# Patient Record
Sex: Female | Born: 1995 | Race: White | Hispanic: Yes | Marital: Married | Smoking: Never smoker
Health system: Southern US, Community
[De-identification: ages and names within clinical notes are randomized; demographics above are authoritative.]

## PROBLEM LIST (undated history)

## (undated) ENCOUNTER — Inpatient Hospital Stay: Payer: Self-pay

## (undated) DIAGNOSIS — N912 Amenorrhea, unspecified: Secondary | ICD-10-CM

## (undated) DIAGNOSIS — Z87718 Personal history of other specified (corrected) congenital malformations of genitourinary system: Secondary | ICD-10-CM

## (undated) DIAGNOSIS — Z9229 Personal history of other drug therapy: Secondary | ICD-10-CM

## (undated) DIAGNOSIS — Z789 Other specified health status: Secondary | ICD-10-CM

## (undated) DIAGNOSIS — N926 Irregular menstruation, unspecified: Secondary | ICD-10-CM

## (undated) DIAGNOSIS — N83209 Unspecified ovarian cyst, unspecified side: Secondary | ICD-10-CM

## (undated) HISTORY — PX: EYE SURGERY: SHX253

## (undated) HISTORY — DX: Unspecified ovarian cyst, unspecified side: N83.209

## (undated) HISTORY — DX: Amenorrhea, unspecified: N91.2

## (undated) HISTORY — DX: Personal history of other drug therapy: Z92.29

## (undated) HISTORY — DX: Irregular menstruation, unspecified: N92.6

## (undated) HISTORY — DX: Personal history of other specified (corrected) congenital malformations of genitourinary system: Z87.718

---
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
## 2014-11-05 NOTE — Consult Note (Signed)
Brief Consult Note: Diagnosis: abdominal pain likley secondary to ruptured left ovarian cyst, previous episode in Sept 2013.   Patient was seen by consultant.   Consult note dictated.   Discussed with Attending MD.   Comments: no need for surgical intervention, she is also rather constipated and this can be addressed once discharged and is no longer having nausea or abdominal pain.  Electronic Signatures: Natale LayBird, Melven Stockard (MD)  (Signed 11-Dec-13 23:05)  Authored: Brief Consult Note   Last Updated: 11-Dec-13 23:05 by Natale LayBird, Rolan Wrightsman (MD)

## 2014-11-05 NOTE — Consult Note (Signed)
PATIENT NAME:  Maria Gentry, Maria Gentry MR#:  [id number] DATE OF BIRTH:  02/09/1996  DATE OF CONSULTATION:  06/28/2012  REFERRING PHYSICIAN:   CONSULTING PHYSICIAN:  Dawt Reeb A. Egbert GaribaldiBird, MD  REASON FOR CONSULTATION: Abdominal pain.   HISTORY: This is a 19 year old otherwise healthy white female who presents to the Emergency Room with the acute onset of left lower quadrant abdominal pain associated with nausea and one episode of emesis with low-grade fever at home. She had a similar presentation but not as intense to the Emergency Room in September of this year and was diagnosed with an ovarian cyst. Gynecological follow up has not been performed. The patient has intermittent menstrual periods. She has had no menstrual period for several weeks. Patient has had no sick contacts. While in the Emergency Room a pelvic ultrasound was performed demonstrating no evidence of an ovarian cyst. CT scan was performed demonstrating no evidence of appendicitis but some small amount of free fluid within the pelvis and bilateral adnexal and ovarian fullness. She is seen with her mother.   ALLERGIES: None.   HOME MEDICATIONS: None.   PAST MEDICAL HISTORY: None.   PAST SURGICAL HISTORY: None.   SOCIAL HISTORY: Negative.   FAMILY HISTORY: Noncontributory.   PHYSICAL EXAMINATION:  GENERAL: The patient is alert and oriented, in no obvious distress.   VITAL SIGNS: Temperature 98.1, pulse 69, respiratory rate 18, blood pressure 191/55, room air saturations 100%.   LUNGS: Clear bilaterally.   HEART: Regular rate and rhythm.   ABDOMEN: Soft, scaphoid, minimal tenderness in the left lower quadrant. No peritoneal signs.   RECTAL/GU: Deferred.   EXTREMITIES: Warm and well perfused.   NEUROLOGIC/PSYCHIATRIC: Normal.   MUSCULOSKELETAL: Grossly normal.   LABORATORY, DIAGNOSTIC, AND RADIOLOGICAL DATA: Urine pregnancy test is negative. White count 6.6, hemoglobin 12.5, platelet count 226,000, normal differential. Urinalysis is  negative. Electrolytes are unremarkable. LFTs normal.   IMPRESSION: 19 year old white female with likely recently ruptured ovarian cyst accounting for her abdominal pain, nausea and low-grade fever. I do not see any clinical signs of appendicitis or surgical problem that need intervention at this time. Of note, the patient is markedly constipated seen on CT scan.   RECOMMENDATIONS: Patient has been admitted to pediatrics for observation, hydration and pain control. I recommend gynecological follow up. This can be done as an outpatient. Furthermore, her constipation can be addressed as an outpatient once she is over her acute illness.   TOTAL TIME SPENT: Half an hour.   Spoke directly with Dr. Olegario Shearerharles Scott regarding her care. Call with any questions or concerns.   ____________________________ Redge GainerMark A. Egbert GaribaldiBird, MD mab:cms D: 06/29/2012 06:52:00 ET T: 06/29/2012 08:41:21 ET JOB#: [id number]  cc: Loraine LericheMark A. Egbert GaribaldiBird, MD, <Dictator>  cc:  Olegario Shearerharles Scott, MD Pediatrics Gabriell Daigneault A Kaylob Wallen MD ELECTRONICALLY SIGNED 06/29/2012 23:24

## 2015-12-18 HISTORY — PX: HYMENECTOMY: SHX987

## 2016-02-03 DIAGNOSIS — Z8742 Personal history of other diseases of the female genital tract: Secondary | ICD-10-CM | POA: Diagnosis not present

## 2016-02-03 DIAGNOSIS — Z01818 Encounter for other preprocedural examination: Secondary | ICD-10-CM | POA: Diagnosis not present

## 2016-02-03 DIAGNOSIS — Q529 Congenital malformation of female genitalia, unspecified: Secondary | ICD-10-CM | POA: Diagnosis not present

## 2016-02-10 DIAGNOSIS — N895 Stricture and atresia of vagina: Secondary | ICD-10-CM | POA: Diagnosis not present

## 2016-02-10 DIAGNOSIS — Q524 Other congenital malformations of vagina: Secondary | ICD-10-CM | POA: Diagnosis not present

## 2016-02-18 DIAGNOSIS — Z09 Encounter for follow-up examination after completed treatment for conditions other than malignant neoplasm: Secondary | ICD-10-CM | POA: Diagnosis not present

## 2016-02-18 DIAGNOSIS — Q529 Congenital malformation of female genitalia, unspecified: Secondary | ICD-10-CM | POA: Diagnosis not present

## 2016-04-13 DIAGNOSIS — R300 Dysuria: Secondary | ICD-10-CM | POA: Diagnosis not present

## 2016-04-13 DIAGNOSIS — R350 Frequency of micturition: Secondary | ICD-10-CM | POA: Diagnosis not present

## 2016-04-13 DIAGNOSIS — N390 Urinary tract infection, site not specified: Secondary | ICD-10-CM | POA: Diagnosis not present

## 2016-04-13 DIAGNOSIS — N3001 Acute cystitis with hematuria: Secondary | ICD-10-CM | POA: Diagnosis not present

## 2016-09-17 DIAGNOSIS — Z131 Encounter for screening for diabetes mellitus: Secondary | ICD-10-CM | POA: Diagnosis not present

## 2016-09-17 DIAGNOSIS — D573 Sickle-cell trait: Secondary | ICD-10-CM | POA: Diagnosis not present

## 2016-09-17 DIAGNOSIS — D582 Other hemoglobinopathies: Secondary | ICD-10-CM | POA: Insufficient documentation

## 2016-09-17 DIAGNOSIS — Z0000 Encounter for general adult medical examination without abnormal findings: Secondary | ICD-10-CM | POA: Diagnosis not present

## 2016-11-05 ENCOUNTER — Encounter: Payer: Self-pay | Admitting: Obstetrics and Gynecology

## 2016-11-05 ENCOUNTER — Ambulatory Visit (INDEPENDENT_AMBULATORY_CARE_PROVIDER_SITE_OTHER): Payer: BLUE CROSS/BLUE SHIELD | Admitting: Obstetrics and Gynecology

## 2016-11-05 VITALS — BP 110/68 | Ht 60.0 in | Wt 117.0 lb

## 2016-11-05 DIAGNOSIS — Z124 Encounter for screening for malignant neoplasm of cervix: Secondary | ICD-10-CM | POA: Diagnosis not present

## 2016-11-05 DIAGNOSIS — N912 Amenorrhea, unspecified: Secondary | ICD-10-CM | POA: Insufficient documentation

## 2016-11-05 DIAGNOSIS — Z01419 Encounter for gynecological examination (general) (routine) without abnormal findings: Secondary | ICD-10-CM | POA: Diagnosis not present

## 2016-11-05 DIAGNOSIS — Z113 Encounter for screening for infections with a predominantly sexual mode of transmission: Secondary | ICD-10-CM

## 2016-11-05 MED ORDER — MEDROXYPROGESTERONE ACETATE 10 MG PO TABS: 10.0000 mg | ORAL_TABLET | Freq: Every day | ORAL | 5 refills | Status: DC

## 2016-11-05 NOTE — Progress Notes (Signed)
HPI:      [name] is a 21 y.o. G0P0000 who LMP was Patient's last menstrual period was 07/12/2016., she presents today for her annual examination. The patient has no complaints today. The patient is sexually active. Her history is significant for no pap smears given age.. The patient does not perform self breast exams.  There is no notable family history of breast or ovarian cancer in her family.  The patient has regular exercise: yes.  The patient denies current symptoms of depression.    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 She reports no residual symptoms. She is able to have intercourse without discomfort.  She also reports a long history of essentially no menses.  She states that she has only ever had menses when provoked by a medicine.  She always does respond to hormonal treatment.  She had a workup for this at [facility] last year which showed no abnormal labs (TSH, prolactin, TSH/FT4, DHEA-S, 17OHP, free testosterone was just below the cutoff for normal range, total testosterone was normal, all other labs were normal). She has no headaches, visual issues, galactorrhea, abdominal pain.  She has normal secondary sex characteristics.  Denies unwanted hair growth or balding pattern.  No menses since January.   GYN History: Contraception: none  PMHx: Past Medical History:  Diagnosis Date  . Amenorrhea   . Cyst of ovary   . History of imperforate hymen   . History of vaccination against human papillomavirus   . Irregular menses    Past Surgical History:  Procedure Laterality Date  . EYE SURGERY    . HYMENECTOMY  12/2015   Family History  Problem Relation Age of Onset  . Skin cancer Mother   . Ovarian cancer Other    Social History  Substance Use Topics  . Smoking status: Never Smoker  . Smokeless tobacco: Never Used  . Alcohol use No    Current Outpatient Prescriptions:  .   medroxyPROGESTERone (PROVERA) 10 MG tablet, Take 1 tablet (10 mg total) by mouth daily. Take one course of this medication every 2 months, Disp: 10 tablet, Rfl: 5 .  norethindrone-ethinyl estradiol (JUNEL FE,GILDESS FE,LOESTRIN FE) 1-20 MG-MCG tablet, Take 1 tablet by mouth daily., Disp: , Rfl:  Allergies: Patient has no known allergies.  Review of Systems  Constitutional: Negative.   HENT: Negative.   Eyes: Negative.   Respiratory: Negative.   Cardiovascular: Negative.   Gastrointestinal: Negative.   Genitourinary: Negative.        Absent menses  Musculoskeletal: Negative.   Skin: Negative.   Neurological: Negative.   Psychiatric/Behavioral: Negative.     Objective: BP 110/68   Ht 5' (1.524 m)   Wt 117 lb (53.1 kg)   LMP 07/12/2016   BMI 22.85 kg/m   Filed Weights   11/05/16 0906  Weight: 117 lb (53.1 kg)   Body mass index is 22.85 kg/m. Physical Exam  Constitutional: She is oriented to person, place, and time. She appears well-developed and well-nourished. No distress.  Genitourinary: Uterus normal. Pelvic exam was performed with patient supine. There is no rash, tenderness, lesion or injury on the right labia. There is no rash, tenderness, lesion or injury on the left labia. No erythema, tenderness or bleeding in the vagina. No signs of injury around the vagina. No vaginal discharge found. Right adnexum does not display mass, does not display tenderness and does not display fullness. Left adnexum does  not display mass, does not display tenderness and does not display fullness. Cervix does not exhibit motion tenderness or discharge.   Uterus is mobile and anteverted. Uterus is not enlarged, tender or exhibiting a mass.  HENT:  Head: Normocephalic and atraumatic.  Eyes: EOM are normal. No scleral icterus.  Neck: Normal range of motion. Neck supple. No thyromegaly present.  Cardiovascular: Normal rate and regular rhythm.  Exam reveals no gallop and no friction rub.   No  murmur heard. Pulmonary/Chest: Effort normal and breath sounds normal. No respiratory distress. She has no wheezes. She has no rales. Right breast exhibits no inverted nipple, no mass, no nipple discharge, no skin change and no tenderness. Left breast exhibits no inverted nipple, no mass, no nipple discharge, no skin change and no tenderness.  Abdominal: Soft. Bowel sounds are normal. She exhibits no distension and no mass. There is no tenderness. There is no rebound and no guarding.  Musculoskeletal: Normal range of motion. She exhibits no edema or tenderness.  Lymphadenopathy:    She has no cervical adenopathy.       Right: No inguinal adenopathy present.       Left: No inguinal adenopathy present.  Neurological: She is alert and oriented to person, place, and time. No cranial nerve deficit.  Skin: Skin is warm and dry. No rash noted. No erythema.  Psychiatric: She has a normal mood and affect. Her behavior is normal. Judgment normal.    Female chaperone present during breast and pelvic examinations.  Assessment:  ANNUAL EXAM 1. Women's annual routine gynecological examination   2. Amenorrhea   3. Pap smear for cervical cancer screening   4. Screen for STD (sexually transmitted disease)      Screening Plan:            1.  Cervical Screening-  Pap smear done today  2. Breast screening- Exam annually and mammogram>40 planned   3. Colonoscopy every 10 years, Hemoccult testing - after age 73  4. Labs None ordered today given recent normal findings at [facility] regarding her amenorrhea.    5. Counseling for contraception: Given her amenorrhea, she does not want to be a contraception at this time. She is fine with getting pregnanct, if it happens.  Other:  Amenorrhea: Discussed many possible causes for amenorrhea. Unclear whether this is primary or secondary given her history of microperforate hymen.  She has had an essentially normal workup.  She always has had response to withdrawal  challenge and to oral contraception.  Plan for now is to give her a withdrawal bleed every 2-3 months (or monthly, if she prefers).  May continues this for endometrial protection.  When she is ready to conceive, return to office for ovulation testing, if she has trouble conceiving.  Discussed that she may, but we have not performed a full workup of her amenorrhea. She states that she is ok with what has been done, but will investigate more if she has trouble with pregnancy.  She might also benefit from a pelvic ultrasound, if necessary.  However, she has had transabdominal pelvic ultrasounds in the past that were notable only for ovarian cysts.     F/U  Return in about 1 year (around 11/05/2017) for Annual Gynecologic Examination.  Thomasene Mohair, MD  [facility], [facility] 11/05/2016  1:08 PM

## 2016-11-09 ENCOUNTER — Encounter: Payer: Self-pay | Admitting: Obstetrics and Gynecology

## 2016-11-09 LAB — IGP, CTNG, RFX APTIMA HPV ASCU
Chlamydia, Nuc. Acid Amp: NEGATIVE
GONOCOCCUS BY NUCLEIC ACID AMP: NEGATIVE
PAP SMEAR COMMENT: 0

## 2017-07-20 ENCOUNTER — Ambulatory Visit (INDEPENDENT_AMBULATORY_CARE_PROVIDER_SITE_OTHER): Payer: 59 | Admitting: Obstetrics and Gynecology

## 2017-07-20 ENCOUNTER — Encounter: Payer: Self-pay | Admitting: Obstetrics and Gynecology

## 2017-07-20 VITALS — BP 122/70 | Ht 60.0 in | Wt 128.0 lb

## 2017-07-20 DIAGNOSIS — N970 Female infertility associated with anovulation: Secondary | ICD-10-CM | POA: Diagnosis not present

## 2017-07-20 NOTE — Progress Notes (Signed)
Obstetrics & Gynecology Office Visit   Chief Complaint  Patient presents with  . infertility   History of Present Illness: 22 y.o. G0P0000 female with a history of microperforate hymen.  She also has a history of oligomenorrhea for which she has been taking provera about every 2-3 months.  She has light bleeding that lasts about 6 days as withdrawal bleeding. She states she changes tampons about 4 times per day during this time. She has been actively trying to get pregnant for about a month. However, she has been having unprotected intercourse for about 1 year.  Her LMP is uncertain. However, she took an OTC ovulation prediction kit test that was positive on dates 12/11-12/14. She has not had bleeding since end of October.  She is also monitoring basal body temperature and has had some elevations, but nothing she would call a significant increase.    Past Medical History:  Diagnosis Date  . Amenorrhea   . Cyst of ovary   . History of imperforate hymen   . History of vaccination against human papillomavirus   . Irregular menses     Past Surgical History:  Procedure Laterality Date  . EYE SURGERY    . HYMENECTOMY  12/2015    Gynecologic History: Patient's last menstrual period was 05/18/2017.  Obstetric History: G0P0000  Family History  Problem Relation Age of Onset  . Skin cancer Mother   . Ovarian cancer Other     Social History   Socioeconomic History  . Marital status: Married    Spouse name: Not on file  . Number of children: Not on file  . Years of education: Not on file  . Highest education level: Not on file  Social Needs  . Financial resource strain: Not on file  . Food insecurity - worry: Not on file  . Food insecurity - inability: Not on file  . Transportation needs - medical: Not on file  . Transportation needs - non-medical: Not on file  Occupational History  . Not on file  Tobacco Use  . Smoking status: Never Smoker  . Smokeless tobacco: Never Used    Substance and Sexual Activity  . Alcohol use: No  . Drug use: Not on file  . Sexual activity: Yes    Birth control/protection: None  Other Topics Concern  . Not on file  Social History Narrative  . Not on file    No Known Allergies  Prior to Admission medications   Medication Sig Start Date End Date Taking? Authorizing Provider  medroxyPROGESTERone (PROVERA) 10 MG tablet Take 1 tablet (10 mg total) by mouth daily. Take one course of this medication every 2 months 11/05/16 11/15/16  Will Bonnet, MD  norethindrone-ethinyl estradiol (JUNEL FE,GILDESS FE,LOESTRIN FE) 1-20 MG-MCG tablet Take 1 tablet by mouth daily.    [provider]    Review of Systems  Constitutional: Negative.   HENT: Negative.   Eyes: Negative.   Respiratory: Negative.   Cardiovascular: Negative.   Gastrointestinal: Negative.   Genitourinary: Negative.   Musculoskeletal: Negative.   Skin: Negative.   Neurological: Negative.   Psychiatric/Behavioral: Negative.      Physical Exam BP 122/70   Ht 5' (1.524 m)   Wt 128 lb (58.1 kg)   LMP 05/18/2017   BMI 25.00 kg/m  Patient's last menstrual period was 05/18/2017. Physical Exam  Constitutional: She is oriented to person, place, and time. She appears well-developed and well-nourished. No distress.  HENT:  Head: Normocephalic and atraumatic.  Eyes: Conjunctivae are normal. No scleral icterus.  Neck: Normal range of motion. Neck supple. No thyromegaly present.  Cardiovascular: Normal rate and regular rhythm.  Pulmonary/Chest: Effort normal and breath sounds normal.  Abdominal: Soft. Bowel sounds are normal. She exhibits no distension and no mass. There is no tenderness. There is no guarding.  Lymphadenopathy:    She has no cervical adenopathy.  Neurological: She is alert and oriented to person, place, and time. No cranial nerve deficit.  Skin: Skin is warm and dry. No rash noted.  Psychiatric: She has a normal mood and affect. Her behavior  is normal. Judgment normal.   Female chaperone present for pelvic and breast  portions of the physical exam  Assessment: 22 y.o. G0P0000 female here for  1. Infertility associated with anovulation      Plan: Problem List Items Addressed This Visit    None    Visit Diagnoses    Infertility associated with anovulation    -  Primary   Relevant Orders   US PELVIS TRANSVANGINAL NON-OB (TV ONLY)     25 minutes spent in face to face discussion with > 50% spent in counseling, management, and coordination of care of her infertility, likely due to anovulation. She has had a workup in the past at [facility] which was normal (TSH, prolactin, TSH/FT4, DHEA-S, 17-OHP, free testosterone was just below cutoff range, total testosterone was normal, all other labs normal).  She has responded to withdrawal progesterone.  Plan to get a pelvic ultrasound, a transvaginal has never been obtained to assess normal anatomy.  Will also give her another week to see if she is pregnant.  Consider HSG to assess her anatomy and use day 21 progesterone to assess ovulation status.  Of negative with normal ultrasound, could consider ovulation induction. May also consider semen analysis.  All of this discussed with her and all questions answered.    Prentice Docker, MD 07/20/2017 6:01 PM

## 2017-07-29 ENCOUNTER — Encounter: Payer: Self-pay | Admitting: Obstetrics and Gynecology

## 2017-07-29 ENCOUNTER — Ambulatory Visit (INDEPENDENT_AMBULATORY_CARE_PROVIDER_SITE_OTHER): Payer: 59 | Admitting: Obstetrics and Gynecology

## 2017-07-29 ENCOUNTER — Ambulatory Visit (INDEPENDENT_AMBULATORY_CARE_PROVIDER_SITE_OTHER): Payer: 59

## 2017-07-29 VITALS — BP 100/60 | HR 66 | Ht 60.0 in | Wt 129.5 lb

## 2017-07-29 DIAGNOSIS — N970 Female infertility associated with anovulation: Secondary | ICD-10-CM

## 2017-07-29 DIAGNOSIS — N912 Amenorrhea, unspecified: Secondary | ICD-10-CM | POA: Diagnosis not present

## 2017-07-29 HISTORY — DX: Female infertility associated with anovulation: N97.0

## 2017-07-29 MED ORDER — MEDROXYPROGESTERONE ACETATE 10 MG PO TABS: 10.0000 mg | ORAL_TABLET | Freq: Every day | ORAL | 0 refills | Status: DC

## 2017-07-29 NOTE — Progress Notes (Signed)
Gynecology Ultrasound Follow Up   Chief Complaint  Patient presents with  . Follow-up    u/s  infertility   History of Present Illness: Patient is a 22 y.o. female who presents today for ultrasound evaluation of the above.  I have personally reviewed the images and report for this ultrasound and my interpretation is reflected below.  Ultrasound demonstrates the following findings Adnexa: ovaries with multiple follicles both with volume > 10 cm^3  Uterus: retroverted with endometrial stripe  5.7 mm Additional: no other additional findings  Past Medical History:  Diagnosis Date  . Amenorrhea   . Cyst of ovary   . History of imperforate hymen   . History of vaccination against human papillomavirus   . Irregular menses     Past Surgical History:  Procedure Laterality Date  . EYE SURGERY    . HYMENECTOMY  12/2015    Family History  Problem Relation Age of Onset  . Skin cancer Mother   . Ovarian cancer Other     Social History   Socioeconomic History  . Marital status: Married    Spouse name: Not on file  . Number of children: Not on file  . Years of education: Not on file  . Highest education level: Not on file  Social Needs  . Financial resource strain: Not on file  . Food insecurity - worry: Not on file  . Food insecurity - inability: Not on file  . Transportation needs - medical: Not on file  . Transportation needs - non-medical: Not on file  Occupational History  . Not on file  Tobacco Use  . Smoking status: Never Smoker  . Smokeless tobacco: Never Used  Substance and Sexual Activity  . Alcohol use: No  . Drug use: No  . Sexual activity: Yes    Birth control/protection: None  Other Topics Concern  . Not on file  Social History Narrative  . Not on file   Allergies: No Known Allergies  Prior to Admission medications   Medication Sig Start Date End Date Taking? Authorizing Provider  medroxyPROGESTERone (PROVERA) 10 MG tablet Take 1 tablet (10 mg  total) by mouth daily. Take one course of this medication every 2 months 11/05/16 11/15/16  Conard Novak, MD    Physical Exam BP 100/60   Pulse 66   Ht 5' (1.524 m)   Wt 129 lb 8 oz (58.7 kg)   BMI 25.29 kg/m    General: NAD HEENT: normocephalic, anicteric Pulmonary: No increased work of breathing Extremities: no edema, erythema, or tenderness Neurologic: Grossly intact, normal gait Psychiatric: mood appropriate, affect full   Assessment: 22 y.o. G0P0000 here for  1. Infertility associated with anovulation   2. Amenorrhea     Plan: Problem List Items Addressed This Visit      Genitourinary   Infertility associated with anovulation - Primary   Relevant Medications   medroxyPROGESTERone (PROVERA) 10 MG tablet     Other   Amenorrhea   Relevant Medications   medroxyPROGESTERone (PROVERA) 10 MG tablet     We discussed multiple methods of investigating fertility issues.  However her symptoms are consistent with anovulation.  Her PCO S labs have been negative in the past.  Therefore, she may have PCOS.  However, she has no other stigmata of PCOS in terms of hyperandrogenemia.  Plan is to induce menses and stimulate ovulation using Clomid.  She is well past day 35 of her cycle.  She took a pregnancy test yesterday  that is negative.  20 minutes spent in face to face discussion with > 50% spent in counseling, management, and coordination of care of her infertility.   Thomasene MohairStephen Maurice Ramseur, MD 07/29/2017 5:42 PM

## 2017-08-10 ENCOUNTER — Encounter: Payer: Self-pay | Admitting: Obstetrics and Gynecology

## 2017-08-15 ENCOUNTER — Encounter: Payer: Self-pay | Admitting: Obstetrics and Gynecology

## 2017-08-16 ENCOUNTER — Other Ambulatory Visit: Payer: Self-pay | Admitting: Obstetrics and Gynecology

## 2017-08-16 ENCOUNTER — Encounter: Payer: Self-pay | Admitting: Obstetrics and Gynecology

## 2017-08-16 DIAGNOSIS — N970 Female infertility associated with anovulation: Secondary | ICD-10-CM

## 2017-08-16 MED ORDER — CLOMIPHENE CITRATE 50 MG PO TABS: 50.0000 mg | ORAL_TABLET | Freq: Every day | ORAL | 0 refills | Status: DC

## 2017-09-05 ENCOUNTER — Encounter: Payer: Self-pay | Admitting: Obstetrics and Gynecology

## 2017-09-05 ENCOUNTER — Other Ambulatory Visit: Payer: Self-pay | Admitting: Obstetrics and Gynecology

## 2017-09-05 DIAGNOSIS — N970 Female infertility associated with anovulation: Secondary | ICD-10-CM

## 2017-09-06 ENCOUNTER — Ambulatory Visit: Payer: 59

## 2017-09-06 DIAGNOSIS — N970 Female infertility associated with anovulation: Secondary | ICD-10-CM

## 2017-09-07 LAB — PROGESTERONE: Progesterone: 0.3 ng/mL

## 2017-09-18 ENCOUNTER — Encounter: Payer: Self-pay | Admitting: Obstetrics and Gynecology

## 2017-09-19 ENCOUNTER — Encounter: Payer: Self-pay | Admitting: Obstetrics and Gynecology

## 2017-09-19 ENCOUNTER — Other Ambulatory Visit: Payer: Self-pay | Admitting: Obstetrics and Gynecology

## 2017-09-19 DIAGNOSIS — N970 Female infertility associated with anovulation: Secondary | ICD-10-CM

## 2017-09-19 MED ORDER — MEDROXYPROGESTERONE ACETATE 10 MG PO TABS: 10.0000 mg | ORAL_TABLET | Freq: Every day | ORAL | 0 refills | Status: DC

## 2017-10-06 ENCOUNTER — Encounter: Payer: Self-pay | Admitting: Obstetrics and Gynecology

## 2017-10-06 ENCOUNTER — Other Ambulatory Visit: Payer: Self-pay | Admitting: Obstetrics and Gynecology

## 2017-10-06 DIAGNOSIS — N970 Female infertility associated with anovulation: Secondary | ICD-10-CM

## 2017-10-06 MED ORDER — CLOMIPHENE CITRATE 50 MG PO TABS: 100.0000 mg | ORAL_TABLET | Freq: Every day | ORAL | 0 refills | Status: DC

## 2017-10-26 ENCOUNTER — Other Ambulatory Visit (INDEPENDENT_AMBULATORY_CARE_PROVIDER_SITE_OTHER): Payer: 59

## 2017-10-26 DIAGNOSIS — N970 Female infertility associated with anovulation: Secondary | ICD-10-CM

## 2017-10-27 ENCOUNTER — Encounter: Payer: Self-pay | Admitting: Obstetrics and Gynecology

## 2017-10-27 LAB — PROGESTERONE: PROGESTERONE: 0.4 ng/mL

## 2017-11-01 ENCOUNTER — Encounter: Payer: Self-pay | Admitting: Obstetrics and Gynecology

## 2017-11-01 ENCOUNTER — Telehealth: Payer: Self-pay | Admitting: Obstetrics and Gynecology

## 2017-11-01 NOTE — Telephone Encounter (Signed)
Left generic VM 

## 2017-11-01 NOTE — Telephone Encounter (Signed)
Spoke with patient regarding medication management for ovulation induction. She has not ovulated with clomid 100 mg. Discussed the option of increasing clomid to 150 mg or switching agents.  Discussed that clomid 150 mg or letrozole (any dose) is an off-label medication/dose for ovulation induction.  She voiced understanding. After discussion, mutual decision made to switch to letrozole in the coming cycle. Will start at 5 mg for her first cycle.   All questions answered.

## 2017-11-01 NOTE — Telephone Encounter (Signed)
Patient is calling for labs results. Please advise. 

## 2017-11-09 ENCOUNTER — Encounter: Payer: Self-pay | Admitting: Obstetrics and Gynecology

## 2017-11-09 ENCOUNTER — Other Ambulatory Visit: Payer: Self-pay | Admitting: Obstetrics and Gynecology

## 2017-11-09 ENCOUNTER — Telehealth: Payer: Self-pay

## 2017-11-09 DIAGNOSIS — N970 Female infertility associated with anovulation: Secondary | ICD-10-CM

## 2017-11-09 NOTE — Telephone Encounter (Signed)
Pt needs refill on provera.  She and SDJ have been messaging about her meds.  States trouble c msg system and wanted to be sure he got this.  [phone]

## 2017-11-10 ENCOUNTER — Other Ambulatory Visit: Payer: Self-pay | Admitting: Obstetrics and Gynecology

## 2017-11-10 DIAGNOSIS — N912 Amenorrhea, unspecified: Secondary | ICD-10-CM

## 2017-11-10 DIAGNOSIS — N970 Female infertility associated with anovulation: Secondary | ICD-10-CM

## 2017-11-10 MED ORDER — MEDROXYPROGESTERONE ACETATE 10 MG PO TABS
10.0000 mg | ORAL_TABLET | Freq: Every day | ORAL | 0 refills | Status: DC
Start: 2017-11-10 — End: 2018-06-02

## 2017-11-27 ENCOUNTER — Encounter: Payer: Self-pay | Admitting: Obstetrics and Gynecology

## 2017-11-28 ENCOUNTER — Encounter: Payer: Self-pay | Admitting: Obstetrics and Gynecology

## 2017-11-29 ENCOUNTER — Other Ambulatory Visit: Payer: Self-pay | Admitting: Obstetrics and Gynecology

## 2017-11-29 DIAGNOSIS — N970 Female infertility associated with anovulation: Secondary | ICD-10-CM

## 2017-11-29 MED ORDER — LETROZOLE 2.5 MG PO TABS: 5.0000 mg | ORAL_TABLET | Freq: Every day | ORAL | 0 refills | Status: AC

## 2017-11-30 NOTE — Telephone Encounter (Signed)
SDJ RXed letrozole yesterday and we have talked to patient.

## 2017-12-15 ENCOUNTER — Other Ambulatory Visit: Payer: Self-pay | Admitting: Obstetrics and Gynecology

## 2017-12-15 ENCOUNTER — Telehealth: Payer: Self-pay

## 2017-12-15 DIAGNOSIS — N970 Female infertility associated with anovulation: Secondary | ICD-10-CM

## 2017-12-15 NOTE — Telephone Encounter (Signed)
Pt calling wanting to let SDJ know that day 21 of cycle will be this Saturday. Can you send this to him and make sure he sees it this PM for lab order?

## 2017-12-15 NOTE — Telephone Encounter (Signed)
Phone line is busy will try to reach patient at another time

## 2017-12-15 NOTE — Telephone Encounter (Signed)
Lm with pt informing her to come in on Monday for labs. Please schedule when pt calls back, or try to call her back soon if she does not call back first to schedule this. Order is in.

## 2017-12-16 NOTE — Telephone Encounter (Signed)
Called and left voice mail for patient to call back to be schedule °

## 2017-12-19 ENCOUNTER — Encounter: Payer: Self-pay | Admitting: Obstetrics and Gynecology

## 2017-12-19 ENCOUNTER — Other Ambulatory Visit: Payer: Self-pay | Admitting: Obstetrics and Gynecology

## 2017-12-19 DIAGNOSIS — N970 Female infertility associated with anovulation: Secondary | ICD-10-CM

## 2017-12-21 ENCOUNTER — Other Ambulatory Visit (INDEPENDENT_AMBULATORY_CARE_PROVIDER_SITE_OTHER): Payer: 59

## 2017-12-21 DIAGNOSIS — N970 Female infertility associated with anovulation: Secondary | ICD-10-CM

## 2017-12-22 LAB — PROGESTERONE: Progesterone: 14.4 ng/mL

## 2017-12-24 ENCOUNTER — Encounter: Payer: Self-pay | Admitting: Obstetrics and Gynecology

## 2018-01-03 ENCOUNTER — Other Ambulatory Visit: Payer: Self-pay | Admitting: Obstetrics and Gynecology

## 2018-01-03 DIAGNOSIS — N970 Female infertility associated with anovulation: Secondary | ICD-10-CM

## 2018-01-04 ENCOUNTER — Other Ambulatory Visit: Payer: Self-pay | Admitting: Obstetrics and Gynecology

## 2018-01-04 DIAGNOSIS — N970 Female infertility associated with anovulation: Secondary | ICD-10-CM

## 2018-01-04 MED ORDER — LETROZOLE 2.5 MG PO TABS: 5.0000 mg | ORAL_TABLET | Freq: Every day | ORAL | 0 refills | Status: AC

## 2018-01-22 ENCOUNTER — Encounter: Payer: Self-pay | Admitting: Obstetrics and Gynecology

## 2018-01-27 ENCOUNTER — Other Ambulatory Visit: Payer: 59

## 2018-01-27 DIAGNOSIS — N970 Female infertility associated with anovulation: Secondary | ICD-10-CM

## 2018-01-28 LAB — PROGESTERONE: PROGESTERONE: 12.4 ng/mL

## 2018-02-03 ENCOUNTER — Other Ambulatory Visit: Payer: Self-pay | Admitting: Obstetrics and Gynecology

## 2018-02-03 DIAGNOSIS — N970 Female infertility associated with anovulation: Secondary | ICD-10-CM

## 2018-02-03 MED ORDER — LETROZOLE 2.5 MG PO TABS: 5.0000 mg | ORAL_TABLET | Freq: Every day | ORAL | 0 refills | Status: AC

## 2018-02-06 ENCOUNTER — Other Ambulatory Visit: Payer: Self-pay | Admitting: Obstetrics and Gynecology

## 2018-02-06 DIAGNOSIS — N970 Female infertility associated with anovulation: Secondary | ICD-10-CM

## 2018-03-01 ENCOUNTER — Other Ambulatory Visit: Payer: 59

## 2018-03-01 DIAGNOSIS — N970 Female infertility associated with anovulation: Secondary | ICD-10-CM

## 2018-03-02 LAB — PROGESTERONE: Progesterone: 18.8 ng/mL

## 2018-03-10 ENCOUNTER — Other Ambulatory Visit: Payer: Self-pay | Admitting: Obstetrics and Gynecology

## 2018-03-10 ENCOUNTER — Telehealth: Payer: Self-pay | Admitting: Obstetrics and Gynecology

## 2018-03-10 DIAGNOSIS — N970 Female infertility associated with anovulation: Secondary | ICD-10-CM

## 2018-03-10 MED ORDER — LETROZOLE 2.5 MG PO TABS: 5.0000 mg | ORAL_TABLET | Freq: Every day | ORAL | 0 refills | Status: AC

## 2018-03-10 NOTE — Telephone Encounter (Signed)
Patient is scheduled for HSG at [facility] on Thursday, 03/16/18 @ 1:30pm, [facility] Lmtrc.

## 2018-03-10 NOTE — Telephone Encounter (Signed)
Lmtrc

## 2018-03-10 NOTE — Telephone Encounter (Signed)
-----   Message from Conard NovakStephen D Jackson, MD sent at 03/10/2018  3:40 PM EDT ----- Regarding: HSG Please schedule for HSG next week. Cycle day 1 is today. She prefers earlier in the week. If that works out and I can block my schedule first thing in the afternoon, that's fine, too. Order is in the system.  Thomasene MohairStephen Jackson, MD

## 2018-03-13 ENCOUNTER — Telehealth: Payer: Self-pay | Admitting: Obstetrics and Gynecology

## 2018-03-13 NOTE — Telephone Encounter (Signed)
Patient has questions inregard to this appointment please advise

## 2018-03-13 NOTE — Telephone Encounter (Signed)
LMVM TRC. I can answer questions about procedure. Insurance questions will be directed to Research officer, political partyractice Administrator in Referral coordinators absence.

## 2018-03-14 NOTE — Telephone Encounter (Signed)
Spoke w/pt. Questions answered about where to report for procedure and procedure itself. Pt has questions about if the procedure is covered by her insurance and what her responsibility will be. It is diagnostic & not fertility related. Cb#[phone]

## 2018-03-16 ENCOUNTER — Ambulatory Visit
Admission: RE | Admit: 2018-03-16 | Discharge: 2018-03-16 | Disposition: A | Discharge status: Home / Self Care | Payer: 59 | Source: Ambulatory Visit | Attending: Obstetrics and Gynecology | Admitting: Obstetrics and Gynecology

## 2018-03-16 DIAGNOSIS — Q51810 Arcuate uterus: Secondary | ICD-10-CM | POA: Insufficient documentation

## 2018-03-16 DIAGNOSIS — N970 Female infertility associated with anovulation: Secondary | ICD-10-CM

## 2018-03-16 DIAGNOSIS — N979 Female infertility, unspecified: Secondary | ICD-10-CM | POA: Insufficient documentation

## 2018-03-16 MED ORDER — IOPAMIDOL (ISOVUE-370) INJECTION 76%
10.0000 mL | Freq: Once | INTRAVENOUS | Status: AC | PRN
  Administered 2018-03-16: 10 mL

## 2018-03-16 NOTE — Procedures (Signed)
Pre-procedure Diagnosis: infertility, female  Post-procedure Diagnosis: 1) infertility, female  2) arcuate uterus  Procedure performed by: Thomasene MohairStephen Avontae Burkhead, MD   Indication: infertility, has not responded to ovulation induction  Complications: none  Patient Disposition: Discharged to home in stable condition  Procedure Details:  Procedure reviewed in detail.  Verification of allergies performed. Prep and drape done.  Cervix normal.  Catheter placed into uterine cavity. Balloon on catheter inflated with air. Approximately 4 mL IsoVue 370 dye injected under fluoroscopic visualization.  Patent bilateral fallopian tube seen.  Uterus somewhat arcuate in contour.  Catheter Patient stable, tolerated procedure well without complication.    Thomasene MohairStephen Crimson Beer, MD, Merlinda FrederickFACOG [facility], [facility] 03/16/2018 1:48 PM

## 2018-03-22 ENCOUNTER — Telehealth: Payer: Self-pay | Admitting: Obstetrics and Gynecology

## 2018-03-22 NOTE — Telephone Encounter (Signed)
Discussed HSG results.  Will get semen analysis if this cycle is not successful

## 2018-03-29 ENCOUNTER — Other Ambulatory Visit: Payer: Self-pay | Admitting: Obstetrics and Gynecology

## 2018-03-29 DIAGNOSIS — N970 Female infertility associated with anovulation: Secondary | ICD-10-CM

## 2018-04-05 ENCOUNTER — Other Ambulatory Visit: Payer: 59

## 2018-04-05 DIAGNOSIS — N970 Female infertility associated with anovulation: Secondary | ICD-10-CM

## 2018-04-06 LAB — PROGESTERONE: Progesterone: 34.2 ng/mL

## 2018-04-07 ENCOUNTER — Telehealth: Payer: Self-pay | Admitting: Obstetrics and Gynecology

## 2018-04-07 NOTE — Telephone Encounter (Signed)
Patient is schedule for NOB appt 04/18/18 with SDJ per patient request

## 2018-04-07 NOTE — Telephone Encounter (Signed)
Talking with pt via pt message. Do you want her to come in early for a blood test or just wait until her appt?

## 2018-04-07 NOTE — Telephone Encounter (Signed)
That's great news! I don't have any reason for her to come in for blood work if her test at home was positive. I'll just look forward to seeing her 10/1!

## 2018-04-07 NOTE — Telephone Encounter (Signed)
Pt aware via message

## 2018-04-07 NOTE — Telephone Encounter (Signed)
Patient is calling to see if she can come in for a blood test for pregnancy. She just cam in on 04/05/18 and now has positive home test. Please advise

## 2018-04-18 ENCOUNTER — Other Ambulatory Visit (HOSPITAL_COMMUNITY)
Admission: RE | Admit: 2018-04-18 | Discharge: 2018-04-18 | Disposition: A | Discharge status: Home / Self Care | Payer: 59 | Source: Ambulatory Visit | Attending: Obstetrics and Gynecology | Admitting: Obstetrics and Gynecology

## 2018-04-18 ENCOUNTER — Encounter: Payer: Self-pay | Admitting: Obstetrics and Gynecology

## 2018-04-18 ENCOUNTER — Ambulatory Visit (INDEPENDENT_AMBULATORY_CARE_PROVIDER_SITE_OTHER): Payer: 59 | Admitting: Obstetrics and Gynecology

## 2018-04-18 VITALS — BP 110/70 | Wt 136.0 lb

## 2018-04-18 DIAGNOSIS — Z3481 Encounter for supervision of other normal pregnancy, first trimester: Secondary | ICD-10-CM | POA: Diagnosis present

## 2018-04-18 DIAGNOSIS — Z113 Encounter for screening for infections with a predominantly sexual mode of transmission: Secondary | ICD-10-CM

## 2018-04-18 DIAGNOSIS — Z3401 Encounter for supervision of normal first pregnancy, first trimester: Secondary | ICD-10-CM

## 2018-04-18 DIAGNOSIS — Z3480 Encounter for supervision of other normal pregnancy, unspecified trimester: Secondary | ICD-10-CM | POA: Insufficient documentation

## 2018-04-18 DIAGNOSIS — Z3A01 Less than 8 weeks gestation of pregnancy: Secondary | ICD-10-CM

## 2018-04-18 LAB — OB RESULTS CONSOLE VARICELLA ZOSTER ANTIBODY, IGG: Varicella: NON-IMMUNE/NOT IMMUNE

## 2018-04-18 LAB — OB RESULTS CONSOLE GC/CHLAMYDIA: Gonorrhea: NEGATIVE

## 2018-04-18 NOTE — Progress Notes (Signed)
New Obstetric Patient H&P   Chief Complaint: "Desires prenatal care"  History of Present Illness: Patient is a 22 y.o. G1P0000 Hispanic or Latino female, LMP 03/10/2018 presents with amenorrhea and positive home pregnancy test. Based on her  LMP, her EDD is Estimated Date of Delivery: 12/15/2018 and her EGA is [redacted]w[redacted]d.  The patient had been taking letrozole 5 mg for ovulation induction and recently underwent an HSG.  She had a urine pregnancy test which was positive 10 or 11 day(s)  ago. Her last menstrual period was normal and lasted for  3 or 4 day(s). Since her LMP she claims she has experienced no issues. She had one episode of vaginal spotting a week ago. Her past medical history is noncontributory. This is her first pregnancy.  Since her LMP, she admits to the use of tobacco products  no She claims she has gained zero  pounds since the start of her pregnancy.  There are cats in the home in the home  yes If yes husband does change litter box. She admits close contact with children on a regular basis  no  She has had chicken pox in the past no She has had Tuberculosis exposures, symptoms, or previously tested positive for TB   no Current or past history of domestic violence. no  Genetic Screening/Teratology Counseling: (Includes patient, baby's father, or anyone in either family with:)   1. Patient's age >/= 49 at [facility]  no 2. Thalassemia ([place], [place], Mediterranean, or Asian background): MCV<80  no 3. Neural tube defect (meningomyelocele, spina bifida, anencephaly)  no 4. Congenital heart defect  no  5. Down syndrome  no 6. Tay-Sachs (Jewish, [place]  no 7. Canavan's Disease  no 8. Sickle cell disease or trait (African)  Yes mom has trait. Husband unknown.  9. Hemophilia or other blood disorders  no  10. Muscular dystrophy  no  11. Cystic fibrosis  no  12. Huntington's Chorea  no  13. Mental retardation/autism  no 14. Other inherited genetic or chromosomal disorder   no 15. Maternal metabolic disorder (DM, PKU, etc)  no 16. Patient or FOB with a child with a birth defect not listed above no  16a. Patient or FOB with a birth defect themselves no 17. Recurrent pregnancy loss, or stillbirth  no  18. Any medications since LMP other than prenatal vitamins (include vitamins, supplements, OTC meds, drugs, alcohol)  Yes, letrozole.  19. Any other genetic/environmental exposure to discuss  no  Infection History:   1. Lives with someone with TB or TB exposed  no  2. Patient or partner has history of genital herpes  no 3. Rash or viral illness since LMP  no 4. History of STI (GC, CT, HPV, syphilis, HIV)  no 5. History of recent travel :  no  Other pertinent information:  no    Review of Systems:10 point review of systems negative unless otherwise noted in HPI  Past Medical History:  Diagnosis Date  . Amenorrhea   . Cyst of ovary   . History of imperforate hymen   . History of vaccination against human papillomavirus   . Irregular menses     Past Surgical History:  Procedure Laterality Date  . EYE SURGERY    . HYMENECTOMY  12/2015    Gynecologic History: Patient's last menstrual period was 03/10/2018.  Obstetric History: G1P0000  Family History  Problem Relation Age of Onset  . Skin cancer Mother   . Ovarian cancer Other  Social History   Socioeconomic History  . Marital status: Married    Spouse name: Not on file  . Number of children: Not on file  . Years of education: Not on file  . Highest education level: Not on file  Occupational History  . Not on file  Social Needs  . Financial resource strain: Not on file  . Food insecurity:    Worry: Not on file    Inability: Not on file  . Transportation needs:    Medical: Not on file    Non-medical: Not on file  Tobacco Use  . Smoking status: Never Smoker  . Smokeless tobacco: Never Used  Substance and Sexual Activity  . Alcohol use: No  . Drug use: No  . Sexual activity:  Yes    Birth control/protection: None  Lifestyle  . Physical activity:    Days per week: Not on file    Minutes per session: Not on file  . Stress: Not on file  Relationships  . Social connections:    Talks on phone: Not on file    Gets together: Not on file    Attends religious service: Not on file    Active member of club or organization: Not on file    Attends meetings of clubs or organizations: Not on file    Relationship status: Not on file  . Intimate partner violence:    Fear of current or ex partner: Not on file    Emotionally abused: Not on file    Physically abused: Not on file    Forced sexual activity: Not on file  Other Topics Concern  . Not on file  Social History Narrative  . Not on file   Allergies: No Known Allergies  Prior to Admission medications: PNV   Physical Exam BP 110/70   Wt 136 lb (61.7 kg)   LMP 03/10/2018   BMI 26.56 kg/m   Physical Exam  Constitutional: She is oriented to person, place, and time. She appears well-developed and well-nourished. No distress.  HENT:  Head: Normocephalic and atraumatic.  Eyes: Conjunctivae are normal.  Neck: Normal range of motion. Neck supple. No thyromegaly present.  Cardiovascular: Normal rate, regular rhythm and normal heart sounds. Exam reveals no gallop and no friction rub.  No murmur heard. Pulmonary/Chest: Effort normal and breath sounds normal. She has no wheezes.  Abdominal: Soft. She exhibits no distension and no mass. There is no tenderness. There is no rebound and no guarding. No hernia. Hernia confirmed negative in the right inguinal area and confirmed negative in the left inguinal area.  Genitourinary: Pelvic exam was performed with patient supine. There is no rash, tenderness or lesion on the right labia. There is no rash, tenderness or lesion on the left labia.  Musculoskeletal: Normal range of motion.  Lymphadenopathy:       Right: No inguinal adenopathy present.       Left: No inguinal  adenopathy present.  Neurological: She is alert and oriented to person, place, and time.  Skin: Skin is warm and dry. No rash noted.  Psychiatric: She has a normal mood and affect. Her behavior is normal.    Female Chaperone present during breast and/or pelvic exam.  Assessment: 22 y.o. G1P0000 at [redacted]w[redacted]d presenting to initiate prenatal care  Plan: 1) Avoid alcoholic beverages. 2) Patient encouraged not to smoke.  3) Discontinue the use of all non-medicinal drugs and chemicals.  4) Take prenatal vitamins daily.  5) Nutrition, food safety (fish, cheese  advisories, and high nitrite foods) and exercise discussed. 6) Hospital and practice style discussed with cross coverage system.  7) Genetic Screening, such as with 1st Trimester Screening, cell free fetal DNA, AFP testing, and Ultrasound, as well as with amniocentesis and CVS as appropriate, is discussed with patient. At the conclusion of today's visit patient undecided genetic testing 8) Patient is asked about travel to areas at risk for the [place] virus, and counseled to avoid travel and exposure to mosquitoes or sexual partners who may have themselves been exposed to the virus. Testing is discussed, and will be ordered as appropriate.   Thomasene Mohair, MD 04/18/2018 2:57 PM

## 2018-04-20 LAB — CERVICOVAGINAL ANCILLARY ONLY
CHLAMYDIA, DNA PROBE: NEGATIVE
NEISSERIA GONORRHEA: NEGATIVE

## 2018-04-20 LAB — URINE DRUG PANEL 7
AMPHETAMINES, URINE: NEGATIVE ng/mL
BARBITURATE QUANT UR: NEGATIVE ng/mL
Benzodiazepine Quant, Ur: NEGATIVE ng/mL
Cannabinoid Quant, Ur: NEGATIVE ng/mL
Cocaine (Metab.): NEGATIVE ng/mL
Opiate Quant, Ur: NEGATIVE ng/mL
PCP Quant, Ur: NEGATIVE ng/mL

## 2018-04-20 LAB — URINE CULTURE

## 2018-04-21 LAB — RPR+RH+ABO+RUB AB+AB SCR+CB...
Antibody Screen: NEGATIVE
HEMATOCRIT: 36.9 % (ref 34.0–46.6)
HEMOGLOBIN: 11.9 g/dL (ref 11.1–15.9)
HEP B S AG: NEGATIVE
HIV Screen 4th Generation wRfx: NONREACTIVE
MCH: 27.5 pg (ref 26.6–33.0)
MCHC: 32.2 g/dL (ref 31.5–35.7)
MCV: 85 fL (ref 79–97)
Platelets: 337 10*3/uL (ref 150–450)
RBC: 4.33 x10E6/uL (ref 3.77–5.28)
RDW: 13.3 % (ref 12.3–15.4)
RH TYPE: POSITIVE
RPR Ser Ql: NONREACTIVE
RUBELLA: 2.82 {index} (ref >0.99)
WBC: 9.7 10*3/uL (ref 3.4–10.8)

## 2018-04-21 LAB — HEMOGLOBINOPATHY EVALUATION
HEMOGLOBIN A2 QUANTITATION: 2.9 % (ref 1.8–3.2)
HEMOGLOBIN F QUANTITATION: 0 % (ref 0.0–2.0)
HGB C: 37.9 % — AB
HGB S: 0 %
HGB VARIANT: 0 %
Hgb A: 59.2 % — ABNORMAL LOW (ref 96.4–98.8)

## 2018-04-28 ENCOUNTER — Ambulatory Visit (INDEPENDENT_AMBULATORY_CARE_PROVIDER_SITE_OTHER): Payer: 59 | Admitting: Obstetrics and Gynecology

## 2018-04-28 ENCOUNTER — Ambulatory Visit (INDEPENDENT_AMBULATORY_CARE_PROVIDER_SITE_OTHER): Payer: 59

## 2018-04-28 ENCOUNTER — Encounter: Payer: Self-pay | Admitting: Obstetrics and Gynecology

## 2018-04-28 VITALS — BP 114/74 | Wt 136.0 lb

## 2018-04-28 DIAGNOSIS — O3680X0 Pregnancy with inconclusive fetal viability, not applicable or unspecified: Secondary | ICD-10-CM | POA: Diagnosis not present

## 2018-04-28 DIAGNOSIS — D573 Sickle-cell trait: Secondary | ICD-10-CM

## 2018-04-28 DIAGNOSIS — O9989 Other specified diseases and conditions complicating pregnancy, childbirth and the puerperium: Secondary | ICD-10-CM

## 2018-04-28 DIAGNOSIS — Z3480 Encounter for supervision of other normal pregnancy, unspecified trimester: Secondary | ICD-10-CM

## 2018-04-28 DIAGNOSIS — Z3A01 Less than 8 weeks gestation of pregnancy: Secondary | ICD-10-CM

## 2018-04-28 NOTE — Progress Notes (Signed)
Routine Prenatal Care Visit  Subjective  Maria Gentry is a 22 y.o. G1P0000 at [redacted]w[redacted]d being seen today for ongoing prenatal care.  She is currently monitored for the following issues for this low-risk pregnancy and has Amenorrhea; Sickle cell trait (HCC); Infertility associated with anovulation; Infertility, female; and Supervision of other normal pregnancy, antepartum on their problem list.  ----------------------------------------------------------------------------------- Patient reports no complaints.  Mild nausea, no emesis.   . Vag. Bleeding: None.   . Denies leaking of fluid.  ----------------------------------------------------------------------------------- The following portions of the patient's history were reviewed and updated as appropriate: allergies, current medications, past family history, past medical history, past social history, past surgical history and problem list. Problem list updated.  Objective  Blood pressure 114/74, weight 136 lb (61.7 kg), last menstrual period 03/10/2018. Pregravid weight 136 lb (61.7 kg) Total Weight Gain 0 lb (0 kg) Urinalysis: Urine Protein    Urine Glucose    Fetal Status: Fetal Heart Rate (bpm): Present         General:  Alert, oriented and cooperative. Patient is in no acute distress.  Skin: Skin is warm and dry. No rash noted.   Cardiovascular: Normal heart rate noted  Respiratory: Normal respiratory effort, no problems with respiration noted  Abdomen: Soft, gravid, appropriate for gestational age. Pain/Pressure: Absent     Pelvic:  Cervical exam deferred        Extremities: Normal range of motion.     Mental Status: Normal mood and affect. Normal behavior. Normal judgment and thought content.   US Ob Comp Less 14 Wks  Result Date: 04/28/2018 Patient Name: Maria Gentry DOB: 01-01-1996 MRN: [id number] ULTRASOUND REPORT Location: [facility] Date of Service: 04/28/2018 Indications: Pregnancy Dating and viability Findings:  Singleton intrauterine pregnancy is visualized with a CRL consistent with [redacted]w[redacted]d gestation, giving an (U/S) EDD of 12/18/18. The (U/S) EDD is consistent with the clinically established EDD of 12/15/18. FHR: 128 BPM CRL measurement: 6.8 mm Yolk sac is visualized and measured 1.9 mm. Amnion: not visualized Right Ovary is normal in appearance.  However, it is surrounded by free fluid that contains  a simple cyst = 1.05 x 1.07 cm attached to an echogenic,vascular structure = 1.73 x 0.9 cm (Differential includes ectopic pregnancy, portion of fallopian tube, etc.) Left Ovary is normal appearance. Corpus luteal cyst:  is not visualized Survey of the adnexa demonstrates no adnexal masses. There is a  free peritoneal fluid in the posterior right  cul de sac. = 3 x 4 cm Impression: 1. [redacted]w[redacted]d Viable Singleton Intrauterine pregnancy by U/S. 2. (U/S) EDD is consistent with Clinically established EDD of 12/15/18. 3. Right Ovary is normal in appearance.  However, it is surrounded by free fluid that contains  a simple cyst = 1.05 x 1.07 cm attached to an echogenic,vascular structure = 1.73 x 0.9 cm (Differential includes ectopic pregnancy, portion of fallopian tube, etc.) 4.free peritoneal fluid in the posterior Right  cul de sac. = 3 x 4cm Recommendations: 1.Clinical correlation with the patient's History and Physical Exam. 2. Follow up ultrasound in one week, if patient is asymptomatic. Abeer Alsammarraie, RDMS The ultrasound images and findings were reviewed by me and I agree with the above report. Thomasene Mohair, MD, [facility], [facility] 04/28/2018 12:42 PM       Assessment   22 y.o. G1P0000 at [redacted]w[redacted]d by  12/15/2018, by Last Menstrual Period presenting for routine prenatal visit  Plan   pregnancy Problems (from 04/18/18 to present)  Problem Noted Resolved   Supervision of other normal pregnancy, antepartum 04/18/2018 by Conard Novak, MD No   Overview Signed 04/28/2018 12:45 PM by Conard Novak, MD    Clinic [facility] Prenatal Labs  Dating L=7 Blood type: B/Positive/-- (10/01 1548)   Genetic Screen 1 Screen:    AFP:     Quad:     NIPS: Antibody:Negative (10/01 1548)  Anatomic [place]  Rubella: 2.82 (10/01 1548) Varicella: @VZVIGG @  GTT Early:               Third trimester:  RPR: Non Reactive (10/01 1548)   Rhogam n/a HBsAg: Negative (10/01 1548)   TDaP vaccine                       Flu Shot: HIV: Non Reactive (10/01 1548)   Baby Food                                GBS:   Contraception  Pap:  CBB     CS/VBAC    Support Person Riki Rusk         Sickle cell trait (HCC) 09/17/2016 by Conard Novak, MD No   Overview Signed 04/28/2018 12:43 PM by Conard Novak, MD    Husband Riki Rusk tested on 10/11 for hemoglobinopathy [ ]          Preterm labor symptoms and general obstetric precautions including but not limited to vaginal bleeding, contractions, leaking of fluid and fetal movement were reviewed in detail with the patient. Please refer to After Visit Summary for other counseling recommendations.   -Discussed ultrasound findings in detail.  Discussed differential, including heterotopic pregnancy, fallopian tube visualized, ruptured cyst, unknown etiology.  Based on the fact that she has no symptoms currently and the risk of heterotopic pregnancy is very low in the general population, plan to follow with serial ultrasound in about 1 week.  Explained both risks and benefits of this approach to the patient and she voiced understanding and agreement with the plan. 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  Return in about 4 days (around 05/02/2018) for u/s for follow up and routine prenatal with Dr. Jean Rosenthal.  Thomasene Mohair, MD, [facility], [facility] 04/28/2018 12:44 PM

## 2018-05-05 ENCOUNTER — Ambulatory Visit (INDEPENDENT_AMBULATORY_CARE_PROVIDER_SITE_OTHER): Payer: 59 | Admitting: Obstetrics and Gynecology

## 2018-05-05 ENCOUNTER — Ambulatory Visit (INDEPENDENT_AMBULATORY_CARE_PROVIDER_SITE_OTHER): Payer: 59

## 2018-05-05 VITALS — BP 116/80 | Wt 137.0 lb

## 2018-05-05 DIAGNOSIS — O9989 Other specified diseases and conditions complicating pregnancy, childbirth and the puerperium: Secondary | ICD-10-CM

## 2018-05-05 DIAGNOSIS — Z3480 Encounter for supervision of other normal pregnancy, unspecified trimester: Secondary | ICD-10-CM

## 2018-05-05 DIAGNOSIS — O3680X0 Pregnancy with inconclusive fetal viability, not applicable or unspecified: Secondary | ICD-10-CM

## 2018-05-05 DIAGNOSIS — Z3A01 Less than 8 weeks gestation of pregnancy: Secondary | ICD-10-CM | POA: Diagnosis not present

## 2018-05-05 DIAGNOSIS — Z3A08 8 weeks gestation of pregnancy: Secondary | ICD-10-CM

## 2018-05-05 DIAGNOSIS — D573 Sickle-cell trait: Secondary | ICD-10-CM

## 2018-05-05 LAB — POCT URINALYSIS DIPSTICK OB
Glucose, UA: NEGATIVE
PROTEIN: NEGATIVE

## 2018-05-05 NOTE — Progress Notes (Signed)
Routine Prenatal Care Visit  Subjective  Maria Gentry is a 22 y.o. G1P0000 at [redacted]w[redacted]d being seen today for ongoing prenatal care.  She is currently monitored for the following issues for this low-risk pregnancy and has Amenorrhea; Sickle cell trait (HCC); Infertility associated with anovulation; Infertility, female; and Supervision of other normal pregnancy, antepartum on their problem list.  ----------------------------------------------------------------------------------- Patient reports no complaints.    . Vag. Bleeding: None.   . Denies leaking of fluid.  No abdominal pain.  U/S shows essentially stable right para-ovarian lesion of uncertain significance.  ----------------------------------------------------------------------------------- The following portions of the patient's history were reviewed and updated as appropriate: allergies, current medications, past family history, past medical history, past social history, past surgical history and problem list. Problem list updated.   Objective  Blood pressure 116/80, weight 137 lb (62.1 kg), last menstrual period 03/10/2018. Pregravid weight 136 lb (61.7 kg) Total Weight Gain 1 lb (0.454 kg) Urinalysis: Urine Protein Negative  Urine Glucose Negative  Fetal Status: Fetal Heart Rate (bpm): Present         General:  Alert, oriented and cooperative. Patient is in no acute distress.  Skin: Skin is warm and dry. No rash noted.   Cardiovascular: Normal heart rate noted  Respiratory: Normal respiratory effort, no problems with respiration noted  Abdomen: Soft, gravid, appropriate for gestational age. Pain/Pressure: Absent     Pelvic:  Cervical exam deferred        Extremities: Normal range of motion.     Mental Status: Normal mood and affect. Normal behavior. Normal judgment and thought content.   Imaging Results: US Ob Comp Less 14 Wks  Result Date: 05/05/2018 .Patient Name: Maria Gentry DOB: 1996-04-04 MRN: [id number] ULTRASOUND  REPORT Location: [facility] Date of Service: 05/05/2018 Indications: Follow up Right adnexal mass, first trimester Findings: Singleton intrauterine pregnancy is visualized with a CRL consistent with [redacted]w[redacted]d gestation, giving an (U/S) EDD of 12/19/18. The (U/S) EDD is consistent with the clinically established EDD of 12/15/18. FHR: 161 BPM CRL measurement: 12.2 mm Yolk sac is visualized and appears normal. Amnion: visualized and appears normal Right Ovary is normal in appearance. However, it is surrounded by free fluid that contains  a simple cyst = 1.13 x 1.01 cm (was1.05 x 1.07 cm) attached to an echogenic structure (questionable vascularity) = 1.75 x 0.73 cm (1.73 x 0.9 cm) (Differential includes ectopic pregnancy, portion of fallopian tube, etc.) Left Ovary is normal appearance. Corpus luteal cyst:  is not visualized There is free peritoneal fluid in the cul de sac. (anterior and posterior) Impression: 1. [redacted]w[redacted]d Viable Singleton Intrauterine pregnancy by U/S. 2. (U/S) EDD is consistent with Clinically established EDD of 12/15/18. 3.Right Ovary is normal in appearance. However, it is surrounded by free fluid that contains  a simple cyst = 1.13 x 1.01 cm (was1.05 x 1.07 cm) attached to an echogenic structure (questionable vascularity) = 1.75 x 0.73 cm (1.73 x 0.9 cm) (Differential includes ectopic pregnancy, portion of fallopian tube, etc.)   Recommendations:  Clinical correlation with the patient's History and Physical Exam. Abeer Alsammarraie,RDMS There is a viable singleton gestation.  Detailed evaluation of the fetal anatomy is precluded by early gestational age.  It must be noted that a normal ultrasound particular at this early gestational age is unable to rule out fetal aneuploidy, risk of first trimester miscarriage, or anatomic birth defects. The structure noted 1 week ago appears stable in size and on doppler interrogation does not appear vascular today.  Jeannett Senior  Jean Rosenthal, MD, [facility], [facility] 05/05/2018 3:06 PM      Assessment   22 y.o. G1P0000 at [redacted]w[redacted]d by  12/15/2018, by Last Menstrual Period presenting for routine prenatal visit  Plan   pregnancy Problems (from 04/18/18 to present)    Problem Noted Resolved   Supervision of other normal pregnancy, antepartum 04/18/2018 by Conard Novak, MD No   Overview Signed 04/28/2018 12:45 PM by Conard Novak, MD    Clinic [facility] Prenatal Labs  Dating L=7 Blood type: B/Positive/-- (10/01 1548)   Genetic Screen 1 Screen:    AFP:     Quad:     NIPS: Antibody:Negative (10/01 1548)  Anatomic [place]  Rubella: 2.82 (10/01 1548) Varicella: @VZVIGG @  GTT Early:               Third trimester:  RPR: Non Reactive (10/01 1548)   Rhogam n/a HBsAg: Negative (10/01 1548)   TDaP vaccine                       Flu Shot: HIV: Non Reactive (10/01 1548)   Baby Food                                GBS:   Contraception  Pap:  CBB     CS/VBAC    Support Person Riki Rusk           Sickle cell trait (HCC) 09/17/2016 by Conard Novak, MD No   Overview Signed 04/28/2018 12:43 PM by Conard Novak, MD    Husband Riki Rusk tested on 10/11 for hemoglobinopathy [ ]           Preterm labor symptoms and general obstetric precautions including but not limited to vaginal bleeding, contractions, leaking of fluid and fetal movement were reviewed in detail with the patient. Please refer to After Visit Summary for other counseling recommendations.   Return in about 4 weeks (around 06/02/2018) for U/s for NT and follow up right ovarian mass with routine prenatal after.  Thomasene Mohair, MD, [facility], [facility] 05/05/2018 3:27 PM

## 2018-06-02 ENCOUNTER — Ambulatory Visit (INDEPENDENT_AMBULATORY_CARE_PROVIDER_SITE_OTHER): Payer: 59 | Admitting: Obstetrics and Gynecology

## 2018-06-02 ENCOUNTER — Ambulatory Visit (INDEPENDENT_AMBULATORY_CARE_PROVIDER_SITE_OTHER): Payer: 59

## 2018-06-02 VITALS — BP 102/60 | Wt 136.0 lb

## 2018-06-02 DIAGNOSIS — Z3A12 12 weeks gestation of pregnancy: Secondary | ICD-10-CM

## 2018-06-02 DIAGNOSIS — Z1379 Encounter for other screening for genetic and chromosomal anomalies: Secondary | ICD-10-CM

## 2018-06-02 DIAGNOSIS — Z3480 Encounter for supervision of other normal pregnancy, unspecified trimester: Secondary | ICD-10-CM

## 2018-06-02 DIAGNOSIS — Z23 Encounter for immunization: Secondary | ICD-10-CM | POA: Diagnosis not present

## 2018-06-02 DIAGNOSIS — Z31438 Encounter for other genetic testing of female for procreative management: Secondary | ICD-10-CM

## 2018-06-02 DIAGNOSIS — Z3682 Encounter for antenatal screening for nuchal translucency: Secondary | ICD-10-CM

## 2018-06-02 DIAGNOSIS — Z3481 Encounter for supervision of other normal pregnancy, first trimester: Secondary | ICD-10-CM

## 2018-06-02 LAB — POCT URINALYSIS DIPSTICK OB
GLUCOSE, UA: NEGATIVE
POC,PROTEIN,UA: NEGATIVE

## 2018-06-02 MED ORDER — METOCLOPRAMIDE HCL 10 MG PO TABS: 10.0000 mg | ORAL_TABLET | Freq: Three times a day (TID) | ORAL | 2 refills | Status: DC

## 2018-06-02 MED ORDER — DOXYLAMINE-PYRIDOXINE ER 20-20 MG PO TBCR: 1.0000 | EXTENDED_RELEASE_TABLET | Freq: Two times a day (BID) | ORAL | 2 refills | Status: AC

## 2018-06-02 NOTE — Progress Notes (Signed)
ROB Nausea every day and some vomiting periodically

## 2018-06-02 NOTE — Progress Notes (Signed)
Routine Prenatal Care Visit  Subjective  Maria Gentry is a 22 y.o. G1P0000 at [redacted]w[redacted]d being seen today for ongoing prenatal care.  She is currently monitored for the following issues for this low-risk pregnancy and has Amenorrhea; Sickle cell trait (HCC); Infertility associated with anovulation; Infertility, female; and Supervision of other normal pregnancy, antepartum on their problem list.  ----------------------------------------------------------------------------------- Patient reports no complaints.   Contractions: Not present. Vag. Bleeding: None.  Movement: Absent. Denies leaking of fluid.  ----------------------------------------------------------------------------------- The following portions of the patient's history were reviewed and updated as appropriate: allergies, current medications, past family history, past medical history, past social history, past surgical history and problem list. Problem list updated.   Objective  Blood pressure 102/60, weight 136 lb (61.7 kg), last menstrual period 03/10/2018. Pregravid weight 136 lb (61.7 kg) Total Weight Gain 0 lb (0 kg) Urinalysis:      Fetal Status: Fetal Heart Rate (bpm): present   Movement: Absent     General:  Alert, oriented and cooperative. Patient is in no acute distress.  Skin: Skin is warm and dry. No rash noted.   Cardiovascular: Normal heart rate noted  Respiratory: Normal respiratory effort, no problems with respiration noted  Abdomen: Soft, gravid, appropriate for gestational age. Pain/Pressure: Absent     Pelvic:  Cervical exam deferred        Extremities: Normal range of motion.     ental Status: Normal mood and affect. Normal behavior. Normal judgment and thought content.   US Ob Comp Less 14 Wks  Result Date: 05/05/2018 .Patient Name: Maria Gentry DOB: 1995/12/01 MRN: [id number] ULTRASOUND REPORT Location: [facility] Date of Service: 05/05/2018 Indications: Follow up Right adnexal mass, first  trimester Findings: Singleton intrauterine pregnancy is visualized with a CRL consistent with [redacted]w[redacted]d gestation, giving an (U/S) EDD of 12/19/18. The (U/S) EDD is consistent with the clinically established EDD of 12/15/18. FHR: 161 BPM CRL measurement: 12.2 mm Yolk sac is visualized and appears normal. Amnion: visualized and appears normal Right Ovary is normal in appearance. However, it is surrounded by free fluid that contains  a simple cyst = 1.13 x 1.01 cm (was1.05 x 1.07 cm) attached to an echogenic structure (questionable vascularity) = 1.75 x 0.73 cm (1.73 x 0.9 cm) (Differential includes ectopic pregnancy, portion of fallopian tube, etc.) Left Ovary is normal appearance. Corpus luteal cyst:  is not visualized There is free peritoneal fluid in the cul de sac. (anterior and posterior) Impression: 1. [redacted]w[redacted]d Viable Singleton Intrauterine pregnancy by U/S. 2. (U/S) EDD is consistent with Clinically established EDD of 12/15/18. 3.Right Ovary is normal in appearance. However, it is surrounded by free fluid that contains  a simple cyst = 1.13 x 1.01 cm (was1.05 x 1.07 cm) attached to an echogenic structure (questionable vascularity) = 1.75 x 0.73 cm (1.73 x 0.9 cm) (Differential includes ectopic pregnancy, portion of fallopian tube, etc.)   Recommendations:  Clinical correlation with the patient's History and Physical Exam. Abeer Alsammarraie,RDMS There is a viable singleton gestation.  Detailed evaluation of the fetal anatomy is precluded by early gestational age.  It must be noted that a normal ultrasound particular at this early gestational age is unable to rule out fetal aneuploidy, risk of first trimester miscarriage, or anatomic birth defects. The structure noted 1 week ago appears stable in size and on doppler interrogation does not appear vascular today.  Thomasene Mohair, MD, [facility], [facility] 05/05/2018 3:06 PM    US Fetal  Nuchal Translucency Measurement  Result Date:  06/02/2018 Patient Name: Maria Gentry DOB: 1995-09-19 MRN: [id number] ULTRASOUND REPORT Location: [facility] Date of Service: 06/02/2018 Indications:First Trimester Screen - NT Findings: Singleton intrauterine pregnancy is visualized with a CRL consistent with 12 w4d  gestation, giving an (U/S) EDD of 12/11/18. The (U/S) EDD is consistent with the clinically established Estimated Date of Delivery: 12/15/18. FHR: 159 BPM CRL measurement: 59.6 mm NT measurement: 1.5 mm. Yolk sac and and early anatomy is normal. Amnion is visualized. Nasal Bone is Present Right Ovary is normal in appearance. Left Ovary is normal in appearance. Survey of the adnexa demonstrates no adnexal masses. There is no free peritoneal fluid in the cul de sac. Impression: 1. [redacted]w[redacted]d viable Singleton Intrauterine pregnancy by U/S. 2. (U/S) EDD is consistent with Clinically established Estimated Date of Delivery: 12/15/18. 3. NT Screen is successfully completed. Recommendations: 1.Clinical correlation with the patient's History and Physical Exam. Abeer Alsammarraie,RDMS There is a viable  singleton gestation.  The fetal biometry correlates with established dating Detailed evaluation of the fetal anatomy is precluded by early gestational age.The NT measurement is less than 3mm.    It must be noted that a normal ultrasound is unable to definitively rule out fetal aneuploidy.  Vena Austria, MD, Evern Core [facility], [facility] 06/02/2018, 2:48 PM.     Assessment   23 y.o. G1P0000 at [redacted]w[redacted]d by  12/15/2018, by Last Menstrual Period presenting for routine prenatal visit  Plan   pregnancy Problems (from 04/18/18 to present)    Problem Noted Resolved   Supervision of other normal pregnancy, antepartum 04/18/2018 by Conard Novak, MD No   Overview Addendum 06/02/2018  3:20 PM by Vena Austria, MD    Clinic [facility] Prenatal Labs  Dating L=7 Blood type: B/Positive/-- (10/01 1548)   Genetic Screen 1 Screen:  06/02/18 Inheritest: 06/02/18 Antibody:Negative (10/01 1548)  Anatomic [place]  Rubella: 2.82 (10/01 1548) Varicella: Non-Immune  GTT Early:               Third trimester:  RPR: Non Reactive (10/01 1548)   Rhogam n/a HBsAg: Negative (10/01 1548)   TDaP vaccine                       Flu Shot: 06/02/18 HIV: Non Reactive (10/01 1548)   Baby Food                                GBS:   Contraception  Pap: 11/05/17 NIL  CBB     CS/VBAC N/A   Support Person Riki Rusk           Sickle cell trait (HCC) 09/17/2016 by Conard Novak, MD No   Overview Signed 04/28/2018 12:43 PM by Conard Novak, MD    Husband Riki Rusk tested on 10/11 for hemoglobinopathy [ ]           Gestational age appropriate obstetric precautions including but not limited to vaginal bleeding, contractions, leaking of fluid and fetal movement were reviewed in detail with the patient.    - normal NT ultrasound - Inheritest today - Rx reglan and Bonjesta for nausea - Discussed benefits of influenza vaccination during pregnancy.  The CDC recommends influenza vaccination for all pregnant patient regardless of trimester.  Besides the benefits of preventing influenza in the mother we discussed transfer of maternal antibodies to fetus as well as secretion of antibodies in  breast milk which are protective for the infant.    In case of positive influenza test in the patient or a close household contact, the use of Tamiflu is also recommended by the CDC.     No follow-ups on file.  Vena AustriaAndreas Bernita Beckstrom, MD, Evern CoreFACOG [facility], [facility] 06/02/2018, 3:22 PM

## 2018-06-05 NOTE — Addendum Note (Signed)
Addended by: [place]az[place]JORDAN, Nil Bolser B on: 06/05/2018 09:14 AM   Modules accepted: Orders

## 2018-06-06 ENCOUNTER — Telehealth: Payer: Self-pay

## 2018-06-06 NOTE — Telephone Encounter (Signed)
[facility] calling for measurements from u/s.  Ref # [id number]  CB# [phone].  Measurements given.  Report will be released and sent to us within the hour.

## 2018-06-12 LAB — INHERITEST CORE(CF97,SMA,FRAX)

## 2018-06-12 LAB — FIRST TRIMESTER SCREEN W/NT
CRL: 59.6 mm
DIA MOM: 0.46
DIA Value: 118.7 pg/mL
Gest Age-Collect: 12.3 weeks
Maternal Age At EDD: 24.2 yr
NUCHAL TRANSLUCENCY MOM: 1.08
NUCHAL TRANSLUCENCY: 1.5 mm
Number of Fetuses: 1
PAPP-A MoM: 0.59
PAPP-A VALUE: 591.8 ng/mL
Test Results:: NEGATIVE
Weight: 136 [lb_av]
hCG MoM: 0.84
hCG Value: 84.6 IU/mL

## 2018-06-23 ENCOUNTER — Telehealth: Payer: Self-pay

## 2018-06-23 NOTE — Telephone Encounter (Signed)
Pt aware.

## 2018-06-23 NOTE — Telephone Encounter (Signed)
Yes, it is Tamiflu and recommneded in pregnnacy for flu

## 2018-06-23 NOTE — Telephone Encounter (Signed)
[facility] pharmacy calling stating they are not able to get in touch c pt.  Pt aware to call them at [phone]

## 2018-06-23 NOTE — Telephone Encounter (Signed)
Pt had a negative flu test.  However the doctor told her she had all the sxs of early flu.  AMS said he'd rx Tamiflu if needed.  Wants to be sure the medication the doctor gave her is safe for the pregnancy.  [phone] The doctor gave her oseltamivir 75mg  one po qd x5d.  The bottle doesn't say it's a generic of Tamiflu but she looked it up and it was.

## 2018-06-30 ENCOUNTER — Encounter: Payer: Self-pay | Admitting: Obstetrics and Gynecology

## 2018-06-30 ENCOUNTER — Ambulatory Visit (INDEPENDENT_AMBULATORY_CARE_PROVIDER_SITE_OTHER): Payer: 59 | Admitting: Obstetrics and Gynecology

## 2018-06-30 VITALS — BP 118/74 | Wt 138.0 lb

## 2018-06-30 DIAGNOSIS — Z361 Encounter for antenatal screening for raised alphafetoprotein level: Secondary | ICD-10-CM

## 2018-06-30 DIAGNOSIS — Z3A16 16 weeks gestation of pregnancy: Secondary | ICD-10-CM

## 2018-06-30 DIAGNOSIS — D573 Sickle-cell trait: Secondary | ICD-10-CM

## 2018-06-30 DIAGNOSIS — Z3480 Encounter for supervision of other normal pregnancy, unspecified trimester: Secondary | ICD-10-CM

## 2018-06-30 NOTE — Progress Notes (Signed)
Routine Prenatal Care Visit  Subjective  Maria Gentry is a 22 y.o. G1P0000 at 3753w0d being seen today for ongoing prenatal care.  She is currently monitored for the following issues for this low-risk pregnancy and has Amenorrhea; Sickle cell trait (HCC); Infertility associated with anovulation; Infertility, female; and Supervision of other normal pregnancy, antepartum on their problem list.  ----------------------------------------------------------------------------------- Patient reports no complaints.   Contractions: Not present. Vag. Bleeding: None.  Movement: Absent. Denies leaking of fluid.  ----------------------------------------------------------------------------------- The following portions of the patient's history were reviewed and updated as appropriate: allergies, current medications, past family history, past medical history, past social history, past surgical history and problem list. Problem list updated.   Objective  Blood pressure 118/74, weight 138 lb (62.6 kg), last menstrual period 03/10/2018. Pregravid weight 136 lb (61.7 kg) Total Weight Gain 2 lb (0.907 kg) Urinalysis: Urine Protein    Urine Glucose    Fetal Status: Fetal Heart Rate (bpm): 150   Movement: Absent     General:  Alert, oriented and cooperative. Patient is in no acute distress.  Skin: Skin is warm and dry. No rash noted.   Cardiovascular: Normal heart rate noted  Respiratory: Normal respiratory effort, no problems with respiration noted  Abdomen: Soft, gravid, appropriate for gestational age. Pain/Pressure: Absent     Pelvic:  Cervical exam deferred        Extremities: Normal range of motion.  Edema: None  Mental Status: Normal mood and affect. Normal behavior. Normal judgment and thought content.   Assessment   22 y.o. G1P0000 at 6553w0d by  12/15/2018, by Last Menstrual Period presenting for routine prenatal visit  Plan   pregnancy Problems (from 04/18/18 to present)    Problem Noted  Resolved   Supervision of other normal pregnancy, antepartum 04/18/2018 by Conard NovakJackson, Shawnia Vizcarrondo D, MD No   Overview Addendum 06/13/2018  5:38 PM by Vena AustriaStaebler, Andreas, MD    Clinic [facility] Prenatal Labs  Dating L=7 Blood type: B/Positive/-- (10/01 1548)   Genetic Screen 1 Screen: negative Inheritest: negative Antibody:Negative (10/01 1548)  Anatomic US  Rubella: 2.82 (10/01 1548) Varicella: Non-Immune  GTT Early:               Third trimester:  RPR: Non Reactive (10/01 1548)   Rhogam n/a HBsAg: Negative (10/01 1548)   TDaP vaccine                       Flu Shot: 06/02/18 HIV: Non Reactive (10/01 1548)   Baby Food                                GBS:   Contraception  Pap: 11/05/17 NIL  CBB     CS/VBAC    Support Person Riki RuskJeremy           Sickle cell trait (HCC) 09/17/2016 by Conard NovakJackson, Elliotte Marsalis D, MD No   Overview Signed 04/28/2018 12:43 PM by Conard NovakJackson, Areen Trautner D, MD    Husband Riki RuskJeremy tested on 10/11 for hemoglobinopathy [ ]           Preterm labor symptoms and general obstetric precautions including but not limited to vaginal bleeding, contractions, leaking of fluid and fetal movement were reviewed in detail with the patient. Please refer to After Visit Summary for other counseling recommendations.   -msAFP today  Return in about 4 weeks (around 07/28/2018) for Anatomy U/S and routine prenatal.  Thomasene MohairStephen Tarrah Furuta, MD,  [facility], [facility] 06/30/2018 8:37 AM

## 2018-07-02 LAB — AFP, SERUM, OPEN SPINA BIFIDA
AFP MoM: 1.34
AFP Value: 46.8 ng/mL
Gest. Age on Collection Date: 16 weeks
MATERNAL AGE AT EDD: 24.1 a
OSBR Risk 1 IN: 4273
TEST RESULTS AFP: NEGATIVE
Weight: 138 [lb_av]

## 2018-07-28 ENCOUNTER — Other Ambulatory Visit: Payer: Self-pay

## 2018-07-28 ENCOUNTER — Ambulatory Visit (INDEPENDENT_AMBULATORY_CARE_PROVIDER_SITE_OTHER): Payer: 59 | Admitting: Obstetrics and Gynecology

## 2018-07-28 ENCOUNTER — Ambulatory Visit (INDEPENDENT_AMBULATORY_CARE_PROVIDER_SITE_OTHER): Payer: 59

## 2018-07-28 ENCOUNTER — Encounter: Payer: Self-pay | Admitting: Obstetrics and Gynecology

## 2018-07-28 VITALS — BP 98/58 | Wt 142.0 lb

## 2018-07-28 DIAGNOSIS — Z3480 Encounter for supervision of other normal pregnancy, unspecified trimester: Secondary | ICD-10-CM

## 2018-07-28 DIAGNOSIS — Z363 Encounter for antenatal screening for malformations: Secondary | ICD-10-CM | POA: Diagnosis not present

## 2018-07-28 DIAGNOSIS — D573 Sickle-cell trait: Secondary | ICD-10-CM

## 2018-07-28 DIAGNOSIS — Z3A20 20 weeks gestation of pregnancy: Secondary | ICD-10-CM

## 2018-07-28 DIAGNOSIS — Z3482 Encounter for supervision of other normal pregnancy, second trimester: Secondary | ICD-10-CM

## 2018-07-28 LAB — POCT URINALYSIS DIPSTICK OB
GLUCOSE, UA: NEGATIVE
PROTEIN: NEGATIVE

## 2018-07-28 NOTE — Progress Notes (Signed)
Routine Prenatal Care Visit  Subjective  Maria Gentry is a 23 y.o. G1P0000 at 107w0d being seen today for ongoing prenatal care.  She is currently monitored for the following issues for this low-risk pregnancy and has Amenorrhea; Sickle cell trait (HCC); Infertility associated with anovulation; Infertility, female; and Supervision of other normal pregnancy, antepartum on their problem list.  ----------------------------------------------------------------------------------- Patient reports no complaints.   Contractions: Not present. Vag. Bleeding: None.  Movement: Present. Denies leaking of fluid.  Anatomy u/s complete today. ----------------------------------------------------------------------------------- The following portions of the patient's history were reviewed and updated as appropriate: allergies, current medications, past family history, past medical history, past social history, past surgical history and problem list. Problem list updated.   Objective  Blood pressure (!) 98/58, weight 142 lb (64.4 kg), last menstrual period 03/10/2018. Pregravid weight 136 lb (61.7 kg) Total Weight Gain 6 lb (2.722 kg) Urinalysis: Urine Protein Negative  Urine Glucose Negative  Fetal Status: Fetal Heart Rate (bpm): Present   Movement: Present     General:  Alert, oriented and cooperative. Patient is in no acute distress.  Skin: Skin is warm and dry. No rash noted.   Cardiovascular: Normal heart rate noted  Respiratory: Normal respiratory effort, no problems with respiration noted  Abdomen: Soft, gravid, appropriate for gestational age. Pain/Pressure: Absent     Pelvic:  Cervical exam deferred        Extremities: Normal range of motion.     Mental Status: Normal mood and affect. Normal behavior. Normal judgment and thought content.   Imaging Results: US Ob Comp + 14 Wk  Result Date: 07/28/2018 Patient Name: Maria Gentry DOB: [birth year] MRN: [id number] ULTRASOUND REPORT Location:  [facility] Date of Service: 07/28/2018 Indications:Anatomy Ultrasound Findings: Mason Jim intrauterine pregnancy is visualized with FHR at 152 BPM. Biometrics give an (U/S) Gestational age of [redacted]w[redacted]d and an (U/S) EDD of 12/17/18; this correlates with the clinically established Estimated Date of Delivery: 12/15/18 Fetal presentation is Breech. EFW: 315 grams (11 oz). Placenta: posterior. Grade: 0. Cervix: 3.6 cm. AFI: subjectively normal. Anatomic survey is complete and normal; Gender - female.  Right Ovary is normal in appearance. Left Ovary is normal appearance. Survey of the adnexa demonstrates no adnexal masses. There is no free peritoneal fluid in the cul de sac. Impression: 1. [redacted]w[redacted]d Viable Singleton Intrauterine pregnancy by U/S. 2. (U/S) EDD is consistent with Clinically established Estimated Date of Delivery: 12/15/18 . 3. Normal Anatomy Scan Darlina Guys, RDMS RVT There is a singleton gestation with subjectively normal amniotic fluid volume. The fetal biometry correlates with established dating. Detailed evaluation of the fetal anatomy was performed.The fetal anatomical survey appears within normal limits within the resolution of ultrasound as described above.  It must be noted that a normal ultrasound is unable to rule out fetal aneuploidy nor is it able to detect all possible malformations. The ultrasound images and findings were reviewed by me and I agree with the above report. Thomasene Mohair, MD, [facility], [facility] 07/28/2018 10:51 AM       Assessment   22 y.o. G1P0000 at [redacted]w[redacted]d by  12/15/2018, by Last Menstrual Period presenting for routine prenatal visit  Plan   pregnancy Problems (from 04/18/18 to present)    Problem Noted Resolved   Supervision of other normal pregnancy, antepartum 04/18/2018 by Conard Novak, MD No   Overview Addendum 06/13/2018  5:38 PM by Vena Austria, MD    Clinic [facility] Prenatal Labs  Dating L=7 Blood  type: B/Positive/-- (10/01  1548)   Genetic Screen 1 Screen: negative Inheritest: negative Antibody:Negative (10/01 1548)  Anatomic [place]  Rubella: 2.82 (10/01 1548) Varicella: Non-Immune  GTT Early:               Third trimester:  RPR: Non Reactive (10/01 1548)   Rhogam n/a HBsAg: Negative (10/01 1548)   TDaP vaccine                       Flu Shot: 06/02/18 HIV: Non Reactive (10/01 1548)   Baby Food                                GBS:   Contraception  Pap: 11/05/17 NIL  CBB     CS/VBAC    Support Person Riki Rusk         Sickle cell trait (HCC) 09/17/2016 by Conard Novak, MD No   Overview Signed 04/28/2018 12:43 PM by Conard Novak, MD    Husband Riki Rusk tested on 10/11 for hemoglobinopathy [ ]           Preterm labor symptoms and general obstetric precautions including but not limited to vaginal bleeding, contractions, leaking of fluid and fetal movement were reviewed in detail with the patient. Please refer to After Visit Summary for other counseling recommendations.   Return in about 4 weeks (around 08/25/2018) for Routine Prenatal Appointment.  Thomasene Mohair, MD, [facility], [facility] 07/28/2018 11:04 AM

## 2018-08-25 ENCOUNTER — Ambulatory Visit (INDEPENDENT_AMBULATORY_CARE_PROVIDER_SITE_OTHER): Payer: 59 | Admitting: Certified Nurse Midwife

## 2018-08-25 VITALS — BP 90/60 | Wt 145.0 lb

## 2018-08-25 DIAGNOSIS — Z3A24 24 weeks gestation of pregnancy: Secondary | ICD-10-CM

## 2018-08-25 DIAGNOSIS — Z113 Encounter for screening for infections with a predominantly sexual mode of transmission: Secondary | ICD-10-CM

## 2018-08-25 DIAGNOSIS — Z131 Encounter for screening for diabetes mellitus: Secondary | ICD-10-CM

## 2018-08-25 DIAGNOSIS — Z130 Encounter for screening for diseases of the blood and blood-forming organs and certain disorders involving the immune mechanism: Secondary | ICD-10-CM

## 2018-08-25 DIAGNOSIS — D582 Other hemoglobinopathies: Secondary | ICD-10-CM

## 2018-08-25 DIAGNOSIS — Z3480 Encounter for supervision of other normal pregnancy, unspecified trimester: Secondary | ICD-10-CM

## 2018-08-25 DIAGNOSIS — Z3482 Encounter for supervision of other normal pregnancy, second trimester: Secondary | ICD-10-CM

## 2018-08-25 LAB — POCT URINALYSIS DIPSTICK OB
GLUCOSE, UA: NEGATIVE
PROTEIN: NEGATIVE

## 2018-08-25 NOTE — Progress Notes (Signed)
ROB- no concerns 

## 2018-08-25 NOTE — Progress Notes (Signed)
ROB at 24 weeks. Has noticed more vaginal discharge, but no itching or irritation. No vaginal bleeding She does not think that her husband had his hemoglobinopathy panel done yet. She will speak with him. Feeling good FM. FHTs WNL ROB in 4 weeks and 28 week labs Given prenatal class schedule  Maria Gentry, CNM

## 2018-09-22 ENCOUNTER — Encounter: Payer: 59 | Admitting: Advanced Practice Midwife

## 2018-09-22 ENCOUNTER — Other Ambulatory Visit: Payer: 59

## 2018-09-22 ENCOUNTER — Encounter: Payer: 59 | Admitting: Obstetrics and Gynecology

## 2018-09-22 ENCOUNTER — Encounter: Payer: Self-pay | Admitting: Obstetrics and Gynecology

## 2018-09-22 ENCOUNTER — Other Ambulatory Visit: Payer: Self-pay | Admitting: Obstetrics and Gynecology

## 2018-09-22 ENCOUNTER — Ambulatory Visit (INDEPENDENT_AMBULATORY_CARE_PROVIDER_SITE_OTHER): Payer: 59 | Admitting: Obstetrics and Gynecology

## 2018-09-22 VITALS — BP 110/60 | Wt 153.0 lb

## 2018-09-22 DIAGNOSIS — Z3480 Encounter for supervision of other normal pregnancy, unspecified trimester: Secondary | ICD-10-CM

## 2018-09-22 DIAGNOSIS — D582 Other hemoglobinopathies: Secondary | ICD-10-CM

## 2018-09-22 DIAGNOSIS — Z131 Encounter for screening for diabetes mellitus: Secondary | ICD-10-CM

## 2018-09-22 DIAGNOSIS — Z113 Encounter for screening for infections with a predominantly sexual mode of transmission: Secondary | ICD-10-CM

## 2018-09-22 DIAGNOSIS — Z3A28 28 weeks gestation of pregnancy: Secondary | ICD-10-CM

## 2018-09-22 DIAGNOSIS — O9989 Other specified diseases and conditions complicating pregnancy, childbirth and the puerperium: Secondary | ICD-10-CM

## 2018-09-22 LAB — POCT URINALYSIS DIPSTICK OB
Glucose, UA: NEGATIVE
POC,PROTEIN,UA: NEGATIVE

## 2018-09-22 NOTE — Progress Notes (Signed)
Routine Prenatal Care Visit  Subjective  Maria Gentry is a 23 y.o. G1P0000 at [redacted]w[redacted]d being seen today for ongoing prenatal care.  She is currently monitored for the following issues for this low-risk pregnancy and has Amenorrhea; Hemoglobin C trait (HCC); Infertility associated with anovulation; Infertility, female; and Supervision of other normal pregnancy, antepartum on their problem list.  ----------------------------------------------------------------------------------- Patient reports no complaints.   Contractions: Not present. Vag. Bleeding: None.  Movement: Present. Denies leaking of fluid.  ----------------------------------------------------------------------------------- The following portions of the patient's history were reviewed and updated as appropriate: allergies, current medications, past family history, past medical history, past social history, past surgical history and problem list. Problem list updated.   Objective  Blood pressure 110/60, weight 153 lb (69.4 kg), last menstrual period 03/10/2018. Pregravid weight 136 lb (61.7 kg) Total Weight Gain 17 lb (7.711 kg) Urinalysis: Urine Protein Negative  Urine Glucose Negative  Fetal Status: Fetal Heart Rate (bpm): 140 Fundal Height: 28 cm Movement: Present     General:  Alert, oriented and cooperative. Patient is in no acute distress.  Skin: Skin is warm and dry. No rash noted.   Cardiovascular: Normal heart rate noted  Respiratory: Normal respiratory effort, no problems with respiration noted  Abdomen: Soft, gravid, appropriate for gestational age. Pain/Pressure: Absent     Pelvic:  Cervical exam deferred        Extremities: Normal range of motion.  Edema: None  Mental Status: Normal mood and affect. Normal behavior. Normal judgment and thought content.   Assessment   23 y.o. G1P0000 at [redacted]w[redacted]d by  12/15/2018, by Last Menstrual Period presenting for routine prenatal visit  Plan   pregnancy Problems (from  04/18/18 to present)    Problem Noted Resolved   Supervision of other normal pregnancy, antepartum 04/18/2018 by Conard Novak, MD No   Overview Addendum 06/13/2018  5:38 PM by Vena Austria, MD    Clinic [facility] Prenatal Labs  Dating L=7 Blood type: B/Positive/-- (10/01 1548)   Genetic Screen 1 Screen: negative Inheritest: negative Antibody:Negative (10/01 1548)  Anatomic [place]  Rubella: 2.82 (10/01 1548) Varicella: Non-Immune  GTT Early:               Third trimester:  RPR: Non Reactive (10/01 1548)   Rhogam n/a HBsAg: Negative (10/01 1548)   TDaP vaccine                       Flu Shot: 06/02/18 HIV: Non Reactive (10/01 1548)   Baby Food                                GBS:   Contraception  Pap: 11/05/17 NIL  CBB     CS/VBAC    Support Person Riki Rusk           Hemoglobin C trait (HCC) 09/17/2016 by Conard Novak, MD No   Overview Addendum 08/25/2018  8:44 AM by Farrel Conners, CNM    Husband Riki Rusk to be tested for hemoglobinopathy [ ] -order in 10/11          Preterm labor symptoms and general obstetric precautions including but not limited to vaginal bleeding, contractions, leaking of fluid and fetal movement were reviewed in detail with the patient. Please refer to After Visit Summary for other counseling recommendations.   - 28 week labs today, RH+ - already signed up for birthing classes. - FOB still hasn't been  tested for hemoglobinopathy yet  Return in about 2 weeks (around 10/06/2018) for Routine Prenatal Appointment with Dr. Devin Going make multiple appts, if desires).  Thomasene Mohair, MD, [facility], [facility] 09/22/2018 10:20 AM

## 2018-09-23 LAB — 28 WEEK RH+PANEL
BASOS ABS: 0 10*3/uL (ref 0.0–0.2)
Basos: 0 %
EOS (ABSOLUTE): 0.1 10*3/uL (ref 0.0–0.4)
Eos: 1 %
Gestational Diabetes Screen: 119 mg/dL (ref 65–139)
HEMATOCRIT: 27.9 % — AB (ref 34.0–46.6)
HEMOGLOBIN: 9.4 g/dL — AB (ref 11.1–15.9)
HIV SCREEN 4TH GENERATION: NONREACTIVE
Immature Grans (Abs): 0.1 10*3/uL (ref 0.0–0.1)
Immature Granulocytes: 1 %
LYMPHS ABS: 1.4 10*3/uL (ref 0.7–3.1)
Lymphs: 13 %
MCH: 28.7 pg (ref 26.6–33.0)
MCHC: 33.7 g/dL (ref 31.5–35.7)
MCV: 85 fL (ref 79–97)
MONOCYTES: 6 %
Monocytes Absolute: 0.7 10*3/uL (ref 0.1–0.9)
NEUTROS ABS: 8.3 10*3/uL — AB (ref 1.4–7.0)
Neutrophils: 79 %
PLATELETS: 282 10*3/uL (ref 150–450)
RBC: 3.28 x10E6/uL — ABNORMAL LOW (ref 3.77–5.28)
RDW: 13.2 % (ref 11.7–15.4)
RPR: NONREACTIVE
WBC: 10.6 10*3/uL (ref 3.4–10.8)

## 2018-09-26 ENCOUNTER — Observation Stay
Admission: EM | Admit: 2018-09-26 | Discharge: 2018-09-26 | Disposition: A | Discharge status: Home / Self Care | Payer: 59 | Attending: Obstetrics & Gynecology | Admitting: Obstetrics & Gynecology

## 2018-09-26 ENCOUNTER — Other Ambulatory Visit: Payer: Self-pay

## 2018-09-26 DIAGNOSIS — O26893 Other specified pregnancy related conditions, third trimester: Principal | ICD-10-CM | POA: Insufficient documentation

## 2018-09-26 DIAGNOSIS — M545 Low back pain, unspecified: Secondary | ICD-10-CM | POA: Diagnosis present

## 2018-09-26 DIAGNOSIS — Z3A28 28 weeks gestation of pregnancy: Secondary | ICD-10-CM | POA: Diagnosis not present

## 2018-09-26 DIAGNOSIS — Z3480 Encounter for supervision of other normal pregnancy, unspecified trimester: Secondary | ICD-10-CM

## 2018-09-26 DIAGNOSIS — D582 Other hemoglobinopathies: Secondary | ICD-10-CM

## 2018-09-26 DIAGNOSIS — N898 Other specified noninflammatory disorders of vagina: Secondary | ICD-10-CM

## 2018-09-26 HISTORY — DX: Other specified health status: Z78.9

## 2018-09-26 LAB — ROM PLUS (ARMC ONLY): ROM PLUS: NEGATIVE

## 2018-09-26 MED ORDER — LACTATED RINGERS IV SOLN: INTRAVENOUS | Status: DC

## 2018-09-26 MED ORDER — LACTATED RINGERS IV SOLN: 500.0000 mL | INTRAVENOUS | Status: DC | PRN

## 2018-09-26 MED ORDER — OXYTOCIN 40 UNITS IN NORMAL SALINE INFUSION - SIMPLE MED: 2.5000 [IU]/h | INTRAVENOUS | Status: DC

## 2018-09-26 MED ORDER — OXYTOCIN BOLUS FROM INFUSION: 500.0000 mL | Freq: Once | INTRAVENOUS | Status: DC

## 2018-09-26 NOTE — Discharge Instructions (Signed)
Back Pain in Pregnancy  Back pain during pregnancy is common. Back pain may be caused by several factors that are related to changes during your pregnancy.  Follow these instructions at home:  Managing pain, stiffness, and swelling          If directed, for sudden (acute) back pain, put ice on the painful area.  ? Put ice in a plastic bag.  ? Place a towel between your skin and the bag.  ? Leave the ice on for 20 minutes, 2-3 times per day.   If directed, apply heat to the affected area before you exercise. Use the heat source that your health care provider recommends, such as a moist heat pack or a heating pad.  ? Place a towel between your skin and the heat source.  ? Leave the heat on for 20-30 minutes.  ? Remove the heat if your skin turns bright red. This is especially important if you are unable to feel pain, heat, or cold. You may have a greater risk of getting burned.   If directed, massage the affected area.  Activity   Exercise as told by your health care provider. Gentle exercise is the best way to prevent or manage back pain.   Listen to your body when lifting. If lifting hurts, ask for help or bend your knees. This uses your leg muscles instead of your back muscles.   Squat down when picking up something from the floor. Do not bend over.   Only use bed rest for short periods as told by your health care provider. Bed rest should only be used for the most severe episodes of back pain.  Standing, sitting, and lying down   Do not stand in one place for long periods of time.   Use good posture when sitting. Make sure your head rests over your shoulders and is not hanging forward. Use a pillow on your lower back if necessary.   Try sleeping on your side, preferably the left side, with a pregnancy support pillow or 1-2 regular pillows between your legs.  ? If you have back pain after a night's rest, your bed may be too soft.  ? A firm mattress may provide more support for your back during  pregnancy.  General instructions   Do not wear high heels.   Eat a healthy diet. Try to gain weight within your health care provider's recommendations.   Use a maternity girdle, elastic sling, or back brace as told by your health care provider.   Take over-the-counter and prescription medicines only as told by your health care provider.   Work with a physical therapist or massage therapist to find ways to manage back pain. Acupuncture or massage therapy may be helpful.   Keep all follow-up visits as told by your health care provider. This is important.  Contact a health care provider if:   Your back pain interferes with your daily activities.   You have increasing pain in other parts of your body.  Get help right away if:   You develop numbness, tingling, weakness, or problems with the use of your arms or legs.   You develop severe back pain that is not controlled with medicine.   You have a change in bowel or bladder control.   You develop shortness of breath, dizziness, or you faint.   You develop nausea, vomiting, or sweating.   You have back pain that is a rhythmic, cramping pain similar to labor pains. Labor   pain is usually 1-2 minutes apart, lasts for about 1 minute, and involves a bearing down feeling or pressure in your pelvis.   You have back pain and your water breaks or you have vaginal bleeding.   You have back pain or numbness that travels down your leg.   Your back pain developed after you fell.   You develop pain on one side of your back.   You see blood in your urine.   You develop skin blisters in the area of your back pain.  Summary   Back pain may be caused by several factors that are related to changes during your pregnancy.   Follow instructions as told by your health care provider for managing pain, stiffness, and swelling.   Exercise as told by your health care provider. Gentle exercise is the best way to prevent or manage back pain.   Take over-the-counter and  prescription medicines only as told by your health care provider.   Keep all follow-up visits as told by your health care provider. This is important.  This information is not intended to replace advice given to you by your health care provider. Make sure you discuss any questions you have with your health care provider.  Document Released: 10/13/2005 Document Revised: 12/21/2017 Document Reviewed: 12/21/2017  Elsevier Interactive Patient Education  2019 [employer]

## 2018-09-26 NOTE — OB Triage Note (Signed)
Pt is a 23 yo G1P0 at [redacted]w[redacted]d that presents from the ED with c/o leaking fluid for the past 2 weeks but more leaking as of today. Pt states fluid was clear and enough to have to wear a panty liner. Pt denies VB, and states positive FM. Pt has no other concerns at this time and vitals WDL. Initial fht 150 with monitors applied and assessing.

## 2018-09-26 NOTE — Final Progress Note (Signed)
Physician Final Progress Note  Patient ID: Maria Gentry MRN: [id number] DOB/AGE: [birth year] 24 y.o.  Admit date: 09/26/2018 Admitting provider: Nadara Mustard, MD Discharge date: 09/26/2018  Admission Diagnoses: Low back Pian, 28 weeks pregnancy, Leakage of fluid  Discharge Diagnoses:  Active Problems:   Low back pain  Consults: None  Significant Findings/ Diagnostic Studies: Patient presented for evaluation of labor.  Patient had cervical exam by RN and this was reported to me. I reviewed her vital signs and fetal tracing, both of which were reassuring.  Patient was discharge as she was not laboring. Neg testing for PPROM No s/sx PTL  Procedures: A NST procedure was performed with FHR monitoring and a normal baseline established, appropriate time of 20-40 minutes of evaluation, and accels >2 seen w 15x15 characteristics.  Results show a REACTIVE NST.   Discharge Condition: good  Disposition: Discharge disposition: 01-Home or Self Care       Diet: Regular diet  Discharge Activity: Activity as tolerated  Discharge Instructions    Call MD for:   Complete by:  As directed    Worsening contractions or pain; leakage of fluid; bleeding.   Diet general   Complete by:  As directed    Increase activity slowly   Complete by:  As directed      Allergies as of 09/26/2018   No Known Allergies     Medication List    TAKE these medications   prenatal vitamin w/FE, FA 27-1 MG Tabs tablet Take 1 tablet by mouth daily at 12 noon.        Total time spent taking care of this patient: TRIAGE  Signed: Letitia Libra 09/26/2018, 7:52 PM

## 2018-09-26 NOTE — Discharge Summary (Signed)
  See FPN 

## 2018-10-06 ENCOUNTER — Other Ambulatory Visit: Payer: Self-pay

## 2018-10-06 ENCOUNTER — Encounter: Payer: Self-pay | Admitting: Obstetrics and Gynecology

## 2018-10-06 ENCOUNTER — Ambulatory Visit (INDEPENDENT_AMBULATORY_CARE_PROVIDER_SITE_OTHER): Payer: 59 | Admitting: Obstetrics and Gynecology

## 2018-10-06 VITALS — BP 110/64 | Wt 155.0 lb

## 2018-10-06 DIAGNOSIS — Z3A30 30 weeks gestation of pregnancy: Secondary | ICD-10-CM

## 2018-10-06 DIAGNOSIS — Z23 Encounter for immunization: Secondary | ICD-10-CM

## 2018-10-06 DIAGNOSIS — O9989 Other specified diseases and conditions complicating pregnancy, childbirth and the puerperium: Secondary | ICD-10-CM

## 2018-10-06 DIAGNOSIS — Z3480 Encounter for supervision of other normal pregnancy, unspecified trimester: Secondary | ICD-10-CM

## 2018-10-06 DIAGNOSIS — D582 Other hemoglobinopathies: Secondary | ICD-10-CM

## 2018-10-06 NOTE — Addendum Note (Signed)
Addended by: Liliane Shi on: 10/06/2018 08:56 AM   Modules accepted: Orders

## 2018-10-06 NOTE — Patient Instructions (Signed)
Coronavirus (COVID-19) Are you at risk?  Are you at risk for the Coronavirus (COVID-19)?  To be considered HIGH RISK for Coronavirus (COVID-19), you have to meet the following criteria:  . Traveled to [place], [place], [place], [place] or [place]; or in the [place] to [place], [place], [place], or [place]; and have fever, cough, and shortness of breath within the last 2 weeks of travel OR . Been in close contact with a person diagnosed with COVID-19 within the last 2 weeks and have fever, cough, and shortness of breath . IF YOU DO NOT MEET THESE CRITERIA, YOU ARE CONSIDERED LOW RISK FOR COVID-19.  What to do if you are HIGH RISK for COVID-19?  . If you are having a medical emergency, call 911. . Seek medical care right away. Before you go to a doctor's office, urgent care or emergency department, call ahead and tell them about your recent travel, contact with someone diagnosed with COVID-19, and your symptoms. You should receive instructions from your physician's office regarding next steps of care.  . When you arrive at healthcare provider, tell the healthcare staff immediately you have returned from visiting [place], [place], [place], [place] or [place]; or traveled in the [place] to [place], [place], [place], or [place]; in the last two weeks or you have been in close contact with a person diagnosed with COVID-19 in the last 2 weeks.   . Tell the health care staff about your symptoms: fever, cough and shortness of breath. . After you have been seen by a medical provider, you will be either: o Tested for (COVID-19) and discharged home on quarantine except to seek medical care if symptoms worsen, and asked to  - Stay home and avoid contact with others until you get your results (4-5 days)  - Avoid travel on public transportation if possible (such as bus, train, or airplane) or o Sent to the Emergency Department by EMS for evaluation, COVID-19 testing, and possible  admission depending on your condition and test results.  What to do if you are LOW RISK for COVID-19?  Reduce your risk of any infection by using the same precautions used for avoiding the common cold or flu:  . Wash your hands often with soap and warm water for at least 20 seconds.  If soap and water are not readily available, use an alcohol-based hand sanitizer with at least 60% alcohol.  . If coughing or sneezing, cover your mouth and nose by coughing or sneezing into the elbow areas of your shirt or coat, into a tissue or into your sleeve (not your hands). . Avoid shaking hands with others and consider head nods or verbal greetings only. . Avoid touching your eyes, nose, or mouth with unwashed hands.  . Avoid close contact with people who are sick. . Avoid places or events with large numbers of people in one location, like concerts or sporting events. . Carefully consider travel plans you have or are making. . If you are planning any travel outside or inside the [place], visit the CDC's Travelers' Health webpage for the latest health notices. . If you have some symptoms but not all symptoms, continue to monitor at home and seek medical attention if your symptoms worsen. . If you are having a medical emergency, call 911.   ADDITIONAL HEALTHCARE OPTIONS FOR PATIENTS  [facility] / e-Visit: [email or web address]         [facility]: [phone]  [facility]: [phone]                   [facility]: [phone]   

## 2018-10-06 NOTE — Progress Notes (Signed)
Routine Prenatal Care Visit  Subjective  Maria Gentry is a 23 y.o. G1P0000 at [redacted]w[redacted]d being seen today for ongoing prenatal care.  She is currently monitored for the following issues for this low-risk pregnancy and has Amenorrhea; Hemoglobin C trait (HCC); Infertility associated with anovulation; Infertility, female; Supervision of other normal pregnancy, antepartum; and Low back pain on their problem list.  ----------------------------------------------------------------------------------- Patient reports no complaints.   Contractions: Not present. Vag. Bleeding: None.  Movement: Present. Denies leaking of fluid.  ----------------------------------------------------------------------------------- The following portions of the patient's history were reviewed and updated as appropriate: allergies, current medications, past family history, past medical history, past social history, past surgical history and problem list. Problem list updated.   Objective  Blood pressure 110/64, weight 155 lb (70.3 kg), last menstrual period 03/10/2018. Pregravid weight 136 lb (61.7 kg) Total Weight Gain 19 lb (8.618 kg) Urinalysis: Urine Protein    Urine Glucose    Fetal Status: Fetal Heart Rate (bpm): 140 Fundal Height: 30 cm Movement: Present     General:  Alert, oriented and cooperative. Patient is in no acute distress.  Skin: Skin is warm and dry. No rash noted.   Cardiovascular: Normal heart rate noted  Respiratory: Normal respiratory effort, no problems with respiration noted  Abdomen: Soft, gravid, appropriate for gestational age. Pain/Pressure: Absent     Pelvic:  Cervical exam deferred        Extremities: Normal range of motion.  Edema: None  Mental Status: Normal mood and affect. Normal behavior. Normal judgment and thought content.   Assessment   23 y.o. G1P0000 at [redacted]w[redacted]d by  12/15/2018, by Last Menstrual Period presenting for routine prenatal visit  Plan   pregnancy Problems (from  04/18/18 to present)    Problem Noted Resolved   Supervision of other normal pregnancy, antepartum 04/18/2018 by Conard Novak, MD No   Overview Addendum 06/13/2018  5:38 PM by Vena Austria, MD    [facility] Prenatal Labs  Dating L=7 Blood type: B/Positive/-- (10/01 1548)   Genetic Screen 1 Screen: negative Inheritest: negative Antibody:Negative (10/01 1548)  Anatomic [place]  Rubella: 2.82 (10/01 1548) Varicella: Non-Immune  GTT Early:               Third trimester:  RPR: Non Reactive (10/01 1548)   Rhogam n/a HBsAg: Negative (10/01 1548)   TDaP vaccine                       Flu Shot: 06/02/18 HIV: Non Reactive (10/01 1548)   Baby Food                                GBS:   Contraception  Pap: 11/05/17 NIL  CBB     CS/VBAC    Support Person Riki Rusk           Hemoglobin C trait (HCC) 09/17/2016 by Conard Novak, MD No   Overview Addendum 08/25/2018  8:44 AM by Farrel Conners, CNM    Husband Riki Rusk to be tested for hemoglobinopathy [ ] -order in 10/11          Preterm labor symptoms and general obstetric precautions including but not limited to vaginal bleeding, contractions, leaking of fluid and fetal movement were reviewed in detail with the patient. Please refer to After Visit Summary for other counseling recommendations.   - reviewed COVID precautions -TDaP today  Return in about 2 weeks (around  10/20/2018) for Routine Prenatal Appointment.  Thomasene Mohair, MD, [facility], [facility] 10/06/2018 8:37 AM

## 2018-10-20 ENCOUNTER — Other Ambulatory Visit: Payer: Self-pay

## 2018-10-20 ENCOUNTER — Ambulatory Visit (INDEPENDENT_AMBULATORY_CARE_PROVIDER_SITE_OTHER): Payer: 59 | Admitting: Certified Nurse Midwife

## 2018-10-20 DIAGNOSIS — Z3A32 32 weeks gestation of pregnancy: Secondary | ICD-10-CM

## 2018-10-20 DIAGNOSIS — Z3483 Encounter for supervision of other normal pregnancy, third trimester: Secondary | ICD-10-CM

## 2018-10-20 DIAGNOSIS — Z3480 Encounter for supervision of other normal pregnancy, unspecified trimester: Secondary | ICD-10-CM

## 2018-10-20 NOTE — Progress Notes (Signed)
Routine Prenatal Care Visit- Virtual Visit  Subjective   Virtual Visit via Telephone Note  I connected with@ on 10/20/18 at  8:30 AM EDT by telephone and verified that I am speaking with the correct person using two identifiers.   I discussed the limitations, risks, security and privacy concerns of performing an evaluation and management service by telephone and the availability of in person appointments. I also discussed with the patient that there may be a patient responsible charge related to this service. The patient expressed understanding and agreed to proceed.  The patient was at home I spoke with the patient from my  workstation The names of people involved in this encounter were: Geralene and Audray Dargenio is a 23 y.o. G1P0000 at [redacted]w[redacted]d for ROB today for ongoing prenatal care.  She is currently monitored for the following issues for this high-risk pregnancy and has Amenorrhea; Hemoglobin C trait (HCC); Infertility associated with anovulation; Infertility, female; Supervision of other normal pregnancy, antepartum; and Low back pain on their problem list.  ----------------------------------------------------------------------------------- Patient reports good fetal movement. She denies vaginal bleeding or leakage of fluid Has 5-10 BH contractions a day. 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 Discussed options for contraception postpartum [facility] instructions reviewed ----------------------------------------------------------------------------------- The following portions of the patient's history were reviewed and updated as appropriate: allergies, current medications, past family history, past medical history, past social history, past surgical history and problem list. Problem list updated.   Objective  Last menstrual period 03/10/2018. Pregravid weight 136 lb (61.7 kg) Total Weight Gain 19 lb (8.618 kg)  Physical Exam could not be performed.  Because of the COVID-19 outbreak this visit was performed over the phone and not in person.   Assessment   23 y.o. G1P0000 at [redacted]w[redacted]d by  12/15/2018, by Last Menstrual Period presenting for routine prenatal visit  Plan   pregnancy Problems (from 04/18/18 to present)    Problem Noted Resolved   Supervision of other normal pregnancy, antepartum 04/18/2018 by Conard Novak, MD No   Overview Addendum 06/13/2018  5:38 PM by Vena Austria, MD    [facility] Prenatal Labs  Dating L=7 Blood type: B/Positive/-- (10/01 1548)   Genetic Screen 1 Screen: negative Inheritest: negative Antibody:Negative (10/01 1548)  Anatomic [place]  Rubella: 2.82 (10/01 1548) Varicella: Non-Immune  GTT Early:               Third trimester:  RPR: Non Reactive (10/01 1548)   Rhogam n/a HBsAg: Negative (10/01 1548)   TDaP vaccine      10/06/18                 Flu Shot: 06/02/18 HIV: Non Reactive (10/01 1548)   Baby Food          Breast                      GBS:   Contraception Discussed options 4/3 Pap: 11/05/17 NIL  CBB     CS/VBAC    Support Person Riki Rusk           Hemoglobin C trait (HCC) 09/17/2016 by Conard Novak, MD No   Overview Addendum 08/25/2018  8:44 AM by Farrel Conners, CNM    Husband Riki Rusk to be tested for hemoglobinopathy [ ] -order in 10/11          Gestational age appropriate obstetric precautions including but not limited to vaginal bleeding, contractions, leaking of fluid and  fetal movement were reviewed in detail with the patient.     Follow Up Instructions: ROB in office in 2 weeks   I discussed the assessment and treatment plan with the patient. The patient was provided an opportunity to ask questions and all were answered. The patient agreed with the plan and demonstrated an understanding of the instructions.   The patient was advised to call back or seek an in-person evaluation if the symptoms worsen or if the condition fails to improve as anticipated.  I provided 10  minutes of non-face-to-face time during this encounter.   Farrel Conners, CNM [facility], [facility] 10/20/2018 8:38 AM

## 2018-11-03 ENCOUNTER — Other Ambulatory Visit: Payer: Self-pay

## 2018-11-03 ENCOUNTER — Ambulatory Visit (INDEPENDENT_AMBULATORY_CARE_PROVIDER_SITE_OTHER): Payer: 59 | Admitting: Obstetrics and Gynecology

## 2018-11-03 ENCOUNTER — Encounter: Payer: Self-pay | Admitting: Obstetrics and Gynecology

## 2018-11-03 DIAGNOSIS — D582 Other hemoglobinopathies: Secondary | ICD-10-CM

## 2018-11-03 DIAGNOSIS — Z3480 Encounter for supervision of other normal pregnancy, unspecified trimester: Secondary | ICD-10-CM

## 2018-11-03 DIAGNOSIS — Z3A34 34 weeks gestation of pregnancy: Secondary | ICD-10-CM

## 2018-11-03 DIAGNOSIS — O9989 Other specified diseases and conditions complicating pregnancy, childbirth and the puerperium: Secondary | ICD-10-CM

## 2018-11-03 NOTE — Progress Notes (Signed)
Routine Prenatal Care Visit- Virtual Visit  Subjective   Virtual Visit via Telephone Note  I connected with Maria Gentry  on 11/03/18 at  8:30 AM EDT by telephone and verified that I am speaking with the correct person using two identifiers.   I discussed the limitations, risks, security and privacy concerns of performing an evaluation and management service by telephone and the availability of in person appointments. I also discussed with the patient that there may be a patient responsible charge related to this service. The patient expressed understanding and agreed to proceed.  The patient was at home I spoke with the patient from my  office The names of people involved in this encounter were: noone.   Maria Gentry is a 23 y.o. G1P0000 at [redacted]w[redacted]d being seen today for ongoing prenatal care.  She is currently monitored for the following issues for this low-risk pregnancy and has Amenorrhea; Hemoglobin C trait (HCC); Infertility associated with anovulation; Infertility, female; Supervision of other normal pregnancy, antepartum; and Low back pain on their problem list.  ----------------------------------------------------------------------------------- Patient reports no complaints.   Contractions: Irritability. Vag. Bleeding: None.  Movement: Present. Denies leaking of fluid.  ----------------------------------------------------------------------------------- The following portions of the patient's history were reviewed and updated as appropriate: allergies, current medications, past family history, past medical history, past social history, past surgical history and problem list. Problem list updated.   Objective  Last menstrual period 03/10/2018. Pregravid weight 136 lb (61.7 kg) Total Weight Gain 19 lb (8.618 kg) Urinalysis:      Fetal Status:     Movement: Present     Physical Exam could not be performed. Because of the COVID-19 outbreak this visit was performed over the  phone and not in person.   Assessment   23 y.o. G1P0000 at [redacted]w[redacted]d by  12/15/2018, by Last Menstrual Period presenting for routine prenatal visit  Plan   pregnancy Problems (from 04/18/18 to present)    Problem Noted Resolved   Supervision of other normal pregnancy, antepartum 04/18/2018 by Conard Novak, MD No   Overview Addendum 10/20/2018  9:32 AM by Farrel Conners, CNM    [facility] Prenatal Labs  Dating L=7 Blood type: B/Positive/-- (10/01 1548)   Genetic Screen 1 Screen: negative Inheritest: negative Antibody:Negative (10/01 1548)  Anatomic [place]  Rubella: 2.82 (10/01 1548) Varicella: Non-Immune  GTT Early:               Third trimester:  RPR: Non Reactive (10/01 1548)   Rhogam n/a HBsAg: Negative (10/01 1548)   TDaP vaccine                       Flu Shot: 06/02/18 HIV: Non Reactive (10/01 1548)   Baby Food        Breast                        GBS:   Contraception  Pap: 11/05/17 NIL  CBB     CS/VBAC    Support Person Riki Rusk           Hemoglobin C trait (HCC) 09/17/2016 by Conard Novak, MD No   Overview Addendum 08/25/2018  8:44 AM by Farrel Conners, CNM    Husband Riki Rusk to be tested for hemoglobinopathy [ ] -order in 10/11          Gestational age appropriate obstetric precautions including but not limited to vaginal bleeding, contractions, leaking of fluid and  fetal movement were reviewed in detail with the patient.     Follow Up Instructions:   I discussed the assessment and treatment plan with the patient. The patient was provided an opportunity to ask questions and all were answered. The patient agreed with the plan and demonstrated an understanding of the instructions.   The patient was advised to call back or seek an in-person evaluation if the symptoms worsen or if the condition fails to improve as anticipated.  I provided 11 minutes of non-face-to-face time during this encounter.  Return in about 2 weeks (around 11/17/2018) for Routine Prenatal  Appointment.  Thomasene MohairStephen Jazmine Heckman, MD  [facility], [facility] 11/03/2018 8:57 AM

## 2018-11-17 ENCOUNTER — Encounter: Payer: 59 | Admitting: Obstetrics and Gynecology

## 2018-11-21 ENCOUNTER — Encounter: Payer: Self-pay | Admitting: Obstetrics and Gynecology

## 2018-11-21 ENCOUNTER — Other Ambulatory Visit (HOSPITAL_COMMUNITY)
Admission: RE | Admit: 2018-11-21 | Discharge: 2018-11-21 | Disposition: A | Discharge status: Home / Self Care | Payer: Medicaid Other | Source: Ambulatory Visit | Attending: Obstetrics and Gynecology | Admitting: Obstetrics and Gynecology

## 2018-11-21 ENCOUNTER — Ambulatory Visit (INDEPENDENT_AMBULATORY_CARE_PROVIDER_SITE_OTHER): Payer: Medicaid Other | Admitting: Obstetrics and Gynecology

## 2018-11-21 ENCOUNTER — Other Ambulatory Visit: Payer: Self-pay

## 2018-11-21 VITALS — BP 118/74 | Wt 165.0 lb

## 2018-11-21 DIAGNOSIS — Z3480 Encounter for supervision of other normal pregnancy, unspecified trimester: Secondary | ICD-10-CM

## 2018-11-21 DIAGNOSIS — D582 Other hemoglobinopathies: Secondary | ICD-10-CM

## 2018-11-21 DIAGNOSIS — Z3A36 36 weeks gestation of pregnancy: Secondary | ICD-10-CM

## 2018-11-21 DIAGNOSIS — Z3483 Encounter for supervision of other normal pregnancy, third trimester: Secondary | ICD-10-CM

## 2018-11-21 LAB — OB RESULTS CONSOLE GC/CHLAMYDIA: Gonorrhea: NEGATIVE

## 2018-11-21 NOTE — Progress Notes (Signed)
Routine Prenatal Care Visit  Subjective  Maria Gentry is a 23 y.o. G1P0000 at 4715w4d being seen today for ongoing prenatal care.  She is currently monitored for the following issues for this low-risk pregnancy and has Amenorrhea; Hemoglobin C trait (HCC); Infertility associated with anovulation; Infertility, female; Supervision of other normal pregnancy, antepartum; and Low back pain on their problem list.  ----------------------------------------------------------------------------------- Patient reports no complaints.   Contractions: Not present. Vag. Bleeding: None.  Movement: Present. Denies leaking of fluid.  ----------------------------------------------------------------------------------- The following portions of the patient's history were reviewed and updated as appropriate: allergies, current medications, past family history, past medical history, past social history, past surgical history and problem list. Problem list updated.   Objective  Blood pressure 118/74, weight 165 lb (74.8 kg), last menstrual period 03/10/2018. Pregravid weight 136 lb (61.7 kg) Total Weight Gain 29 lb (13.2 kg) Urinalysis: Urine Protein    Urine Glucose    Fetal Status: Fetal Heart Rate (bpm): 140 Fundal Height: 36 cm Movement: Present  Presentation: Vertex  General:  Alert, oriented and cooperative. Patient is in no acute distress.  Skin: Skin is warm and dry. No rash noted.   Cardiovascular: Normal heart rate noted  Respiratory: Normal respiratory effort, no problems with respiration noted  Abdomen: Soft, gravid, appropriate for gestational age. Pain/Pressure: Absent     Pelvic:  Cervical exam deferred        Extremities: Normal range of motion.  Edema: None  Mental Status: Normal mood and affect. Normal behavior. Normal judgment and thought content.   Assessment   23 y.o. G1P0000 at 3715w4d by  12/15/2018, by Last Menstrual Period presenting for routine prenatal visit  Plan   pregnancy  Problems (from 04/18/18 to present)    Problem Noted Resolved   Supervision of other normal pregnancy, antepartum 04/18/2018 by Conard NovakJackson, Kameisha Malicki D, MD No   Overview Addendum 10/20/2018  9:32 AM by Farrel ConnersGutierrez, Colleen, CNM    Clinic [facility] Prenatal Labs  Dating L=7 Blood type: B/Positive/-- (10/01 1548)   Genetic Screen 1 Screen: negative Inheritest: negative Antibody:Negative (10/01 1548)  Anatomic US  Rubella: 2.82 (10/01 1548) Varicella: Non-Immune  GTT Early:               Third trimester:  RPR: Non Reactive (10/01 1548)   Rhogam n/a HBsAg: Negative (10/01 1548)   TDaP vaccine                       Flu Shot: 06/02/18 HIV: Non Reactive (10/01 1548)   Baby Food        Breast                        GBS:   Contraception  Pap: 11/05/17 NIL  CBB     CS/VBAC    Support Person Maria Gentry           Hemoglobin C trait (HCC) 09/17/2016 by Conard NovakJackson, Allison Deshotels D, MD No   Overview Addendum 08/25/2018  8:44 AM by Farrel ConnersGutierrez, Colleen, CNM    Husband Maria Gentry to be tested for hemoglobinopathy [ ] -order in 10/11          Preterm labor symptoms and general obstetric precautions including but not limited to vaginal bleeding, contractions, leaking of fluid and fetal movement were reviewed in detail with the patient. Please refer to After Visit Summary for other counseling recommendations.   Return in about 1 week (around 11/28/2018) for Routine Prenatal Appointment.  Maria Gentry  Jean Rosenthal, MD, [facility], [facility] 11/21/2018 8:48 AM

## 2018-11-23 LAB — STREP GP B NAA: Strep Gp B NAA: NEGATIVE

## 2018-11-23 LAB — CERVICOVAGINAL ANCILLARY ONLY
Chlamydia: NEGATIVE
Neisseria Gonorrhea: NEGATIVE

## 2018-11-28 ENCOUNTER — Ambulatory Visit (INDEPENDENT_AMBULATORY_CARE_PROVIDER_SITE_OTHER): Payer: Medicaid Other | Admitting: Certified Nurse Midwife

## 2018-11-28 ENCOUNTER — Other Ambulatory Visit: Payer: Self-pay

## 2018-11-28 VITALS — BP 96/54 | Wt 164.0 lb

## 2018-11-28 DIAGNOSIS — Z3A37 37 weeks gestation of pregnancy: Secondary | ICD-10-CM

## 2018-11-28 DIAGNOSIS — Z3483 Encounter for supervision of other normal pregnancy, third trimester: Secondary | ICD-10-CM

## 2018-11-28 DIAGNOSIS — Z3480 Encounter for supervision of other normal pregnancy, unspecified trimester: Secondary | ICD-10-CM

## 2018-11-28 LAB — POCT URINALYSIS DIPSTICK OB
Glucose, UA: NEGATIVE
POC,PROTEIN,UA: NEGATIVE

## 2018-11-28 NOTE — Progress Notes (Signed)
ROB at 37wk4d: Doing well. Having some irregular BH contractions. No leakage of water or bleeding. Baby active. Has bags packed for the hospital. FHTs 158 and FH 36 cm. Ultrasound confirmed vertex presentation GBS negative Labor precautions ROB 1 week Farrel Conners, CNM

## 2018-11-28 NOTE — Progress Notes (Signed)
ROB- no concerns 

## 2018-12-03 ENCOUNTER — Observation Stay
Admission: EM | Admit: 2018-12-03 | Discharge: 2018-12-03 | Disposition: A | Discharge status: Home / Self Care | Payer: Medicaid Other | Attending: Obstetrics and Gynecology | Admitting: Obstetrics and Gynecology

## 2018-12-03 ENCOUNTER — Encounter: Payer: Self-pay | Admitting: *Deleted

## 2018-12-03 ENCOUNTER — Other Ambulatory Visit: Payer: Self-pay

## 2018-12-03 DIAGNOSIS — Z3A38 38 weeks gestation of pregnancy: Secondary | ICD-10-CM | POA: Diagnosis not present

## 2018-12-03 DIAGNOSIS — M7989 Other specified soft tissue disorders: Secondary | ICD-10-CM | POA: Diagnosis present

## 2018-12-03 DIAGNOSIS — Z3480 Encounter for supervision of other normal pregnancy, unspecified trimester: Secondary | ICD-10-CM

## 2018-12-03 DIAGNOSIS — O1203 Gestational edema, third trimester: Secondary | ICD-10-CM | POA: Diagnosis present

## 2018-12-03 DIAGNOSIS — D582 Other hemoglobinopathies: Secondary | ICD-10-CM

## 2018-12-03 NOTE — OB Triage Note (Signed)
Pt c/o swelling since yesterday am. Denies HA, visual disturbances, epigastric pain. Reports good fetal movement. Denies LOF, vaginal bleeding. Elaina Hoops

## 2018-12-03 NOTE — Discharge Summary (Signed)
Physician Discharge Summary   Patient ID: Maria Gentry [id number] 23 y.o. 1995-11-09  Admit date: 12/03/2018  Discharge date and time: No discharge date for patient encounter.   Admitting Physician: Natale Milch, MD   Discharge Physician: Adelene Idler MD  Admission Diagnoses: 38 weeks,swollen legs  Discharge Diagnoses: same as above  Admission Condition: good  Discharged Condition: good  Indication for Admission: Swelling of lower extremeties  Hospital Course: Patient reported that she noticed swelling of her legs last night. They are equal in size. She was concerned because the swelling did not go down after she slept. She called the triage and reported that one leg was larger than the other although now she agrees that they are the same size. She has no other complaint, occasional braxton hicks contraction. Notes good fetal movement. Denies vaginal bleeding or leakage of fluid.   Consults: None  Significant Diagnostic Studies: none  Treatments: none  Discharge Exam: BP 117/62   Pulse 72   Temp 98.2 F (36.8 C) (Oral)   Ht 5' (1.524 m)   Wt 74.4 kg   LMP 03/10/2018   BMI 32.03 kg/m   General Appearance:    Alert, cooperative, no distress, appears stated age  Head:    Normocephalic, without obvious abnormality, atraumatic  Eyes:    PERRL, conjunctiva/corneas clear, EOM's intact, fundi    benign, both eyes  Ears:    Normal TM's and external ear canals, both ears  Nose:   Nares normal, septum midline, mucosa normal, no drainage    or sinus tenderness  Throat:   Lips, mucosa, and tongue normal; teeth and gums normal  Neck:   Supple, symmetrical, trachea midline, no adenopathy;    thyroid:  no enlargement/tenderness/nodules; no carotid   bruit or JVD  Back:     Symmetric, no curvature, ROM normal, no CVA tenderness  Lungs:     Clear to auscultation bilaterally, respirations unlabored  Chest Wall:    No tenderness or deformity   Heart:    Regular  rate and rhythm, S1 and S2 normal, no murmur, rub   or gallop  Breast Exam:    No tenderness, masses, or nipple abnormality  Abdomen:     Soft, non-tender, bowel sounds active all four quadrants,    no masses, no organomegaly  Genitalia:    Normal female without lesion, discharge or tenderness  Rectal:    Normal tone, normal prostate, no masses or tenderness;   guaiac negative stool  Extremities:   Extremities normal, atraumatic, no cyanosis or edema  Pulses:   2+ and symmetric all extremities  Skin:   Skin color, texture, turgor normal, no rashes or lesions  Lymph nodes:   Cervical, supraclavicular, and axillary nodes normal  Neurologic:   CNII-XII intact, normal strength, sensation and reflexes    throughout    Disposition: Discharge disposition: 01-Home or Self Care       Patient Instructions:  Allergies as of 12/03/2018   No Known Allergies     Medication List    TAKE these medications   prenatal vitamin w/FE, FA 27-1 MG Tabs tablet Take 1 tablet by mouth daily at 12 noon.      Activity: activity as tolerated Diet: regular diet Wound Care: none needed  Follow-up with [facility] in 3 days.  Signed: Natale Milch 12/03/2018 2:55 PM

## 2018-12-06 ENCOUNTER — Other Ambulatory Visit: Payer: Self-pay

## 2018-12-06 ENCOUNTER — Encounter: Payer: Self-pay | Admitting: Obstetrics and Gynecology

## 2018-12-06 ENCOUNTER — Ambulatory Visit (INDEPENDENT_AMBULATORY_CARE_PROVIDER_SITE_OTHER): Payer: Medicaid Other | Admitting: Obstetrics and Gynecology

## 2018-12-06 VITALS — BP 110/70 | Wt 169.0 lb

## 2018-12-06 DIAGNOSIS — Z3A38 38 weeks gestation of pregnancy: Secondary | ICD-10-CM

## 2018-12-06 DIAGNOSIS — Z3483 Encounter for supervision of other normal pregnancy, third trimester: Secondary | ICD-10-CM

## 2018-12-06 DIAGNOSIS — Z3480 Encounter for supervision of other normal pregnancy, unspecified trimester: Secondary | ICD-10-CM

## 2018-12-06 DIAGNOSIS — D582 Other hemoglobinopathies: Secondary | ICD-10-CM

## 2018-12-06 NOTE — Progress Notes (Signed)
  [facility] INDUCTION ASSESS[facility] Maria Gentry Mar 31, 1996 Medical record #: [id number] Phone #:  Home Phone [phone]  Mobile [phone]    Prenatal Provider:[facility]ivering [facility] Proposed admission date/time:12/16/2018 at 0800 Method of induction:Cytotec  Weight: Filed Weights05/20/20 0811Weight:169 lb (76.7 kg) BMI Body mass index is 33.01 kg/m. HIV Negative HSV Negative EDC Estimated Date of Delivery: 5/29/20based on:LMP  Gestational age on admission: [redacted]w[redacted]d Gravidity/parity:G1P0000  Cervix Score   0 1 2 3   Position Posterior Midposition Anterior   Consistency Firm Medium Soft   Effacement (%) 0-30 40-50 60-70 >80  Dilation (cm) Closed 1-2 3-4 >5  Baby's station -3 -2 -1 +1, +2   Bishop Score: low   select indication(s) below Elective induction ?40 weeks primiparous patient   Medical Indications Adapted from ACOG Committee Opinion #560, "Medically Indicated Late Preterm and Early Term Deliveries," 2013.  PLACENTAL / UTERINE ISSUES FETAL ISSUES MATERNAL ISSUES  ? 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 Isoimmunization (37.0-38.6) ? Preeclampsia without severe features or gestational HTN (37.0)  ? Suspected accreta (34.0-35.6) ? Growth Restriction Mason Jim) ? Preeclampsia with severe features (34.0)  ? Prior classical CD, uterine window, rupture (36.0-37.6) ? Isolated (38.0-39.6) ? Chronic HTN (38.0-39.6)  ? Prior myomectomy (37.0-38.6) ? Concurrent findings (34.0-37.6) ? Cholestasis (37.0)  ? Umbilical vein varix (37.0) ? Growth Restriction (Twins) ? Diabetes  ? Placental abruption (chronic) ? Di-Di Isolated (36.0-37.6) ? Pregestational, controlled (39.0)  OBSTETRIC ISSUES ? Di-Di concurrent findings (32.0-34.6) ? Pregestational, uncontrolled (37.0-39.0)  ? Postdates ? (41 weeks) ? Mo-Di isolated (32.0-34.6) ? Pregestational, vascular compromise (37.0- 39.0)  ? PPROM (34.0) ? Multiple Gestation ? Gestational, diet controlled  (40.0)  ? Hx of IUFD (39.0 weeks) ? Di-Di (38.0-38.6) ? Gestational, med controlled (39.0)  ? Polyhydramnios, mild/moderate; SDV 8-16 or AFI 25-35 (39.0) ? Mo-Di (36.0-37.6) ? Gestational, uncontrolled (38.0-39.0)  ? Oligohydramnios (36.0-37.6); MVP <2 cm  For indications not listed above, delivery recommendations from maternal-fetal medicine consultant occurred on: Date:n/a with Dr. Gretta Cool for indication of:n/a  Provider Signature: Thomasene Mohair Scheduled by: L&D Date:12/06/2018 8:39 AM   Call [phone] to finalize the induction date/time  [id number] (07/17)

## 2018-12-06 NOTE — Progress Notes (Signed)
Routine Prenatal Care Visit  Subjective  Maria Gentry is a 23 y.o. G1P0000 at 2981w5d being seen today for ongoing prenatal care.  She is currently monitored for the following issues for this low-risk pregnancy and has Amenorrhea; Hemoglobin C trait (HCC); Infertility associated with anovulation; Infertility, female; Supervision of other normal pregnancy, antepartum; Low back pain; and Swelling of both lower extremities on their problem list.  ----------------------------------------------------------------------------------- Patient reports no complaints.   Contractions: Irritability. Vag. Bleeding: None.  Movement: Present. Denies leaking of fluid.  ----------------------------------------------------------------------------------- The following portions of the patient's history were reviewed and updated as appropriate: allergies, current medications, past family history, past medical history, past social history, past surgical history and problem list. Problem list updated.   Objective  Blood pressure 110/70, weight 169 lb (76.7 kg), last menstrual period 03/10/2018. Pregravid weight 136 lb (61.7 kg) Total Weight Gain 33 lb (15 kg) Urinalysis: Urine Protein    Urine Glucose    Fetal Status: Fetal Heart Rate (bpm): 135 Fundal Height: 39 cm Movement: Present  Presentation: Vertex  General:  Alert, oriented and cooperative. Patient is in no acute distress.  Skin: Skin is warm and dry. No rash noted.   Cardiovascular: Normal heart rate noted  Respiratory: Normal respiratory effort, no problems with respiration noted  Abdomen: Soft, gravid, appropriate for gestational age. Pain/Pressure: Absent     Pelvic:  Cervical exam deferred        Extremities: Normal range of motion.  Edema: Trace  Mental Status: Normal mood and affect. Normal behavior. Normal judgment and thought content.   Assessment   23 y.o. G1P0000 at 6481w5d by  12/15/2018, by Last Menstrual Period presenting for routine  prenatal visit  Plan   pregnancy Problems (from 04/18/18 to present)    Problem Noted Resolved   Supervision of other normal pregnancy, antepartum 04/18/2018 by Conard NovakJackson, Kahli Mayon D, MD No   Overview Addendum 10/20/2018  9:32 AM by Farrel ConnersGutierrez, Colleen, CNM    Clinic [facility] Prenatal Labs  Dating L=7 Blood type: B/Positive/-- (10/01 1548)   Genetic Screen 1 Screen: negative Inheritest: negative Antibody:Negative (10/01 1548)  Anatomic US  Rubella: 2.82 (10/01 1548) Varicella: Non-Immune  GTT Early:               Third trimester:  RPR: Non Reactive (10/01 1548)   Rhogam n/a HBsAg: Negative (10/01 1548)   TDaP vaccine                       Flu Shot: 06/02/18 HIV: Non Reactive (10/01 1548)   Baby Food        Breast                        GBS:   Contraception  Pap: 11/05/17 NIL  CBB     CS/VBAC    Support Person Riki RuskJeremy           Hemoglobin C trait (HCC) 09/17/2016 by Conard NovakJackson, Dewell Monnier D, MD No   Overview Addendum 08/25/2018  8:44 AM by Farrel ConnersGutierrez, Colleen, CNM    Husband Riki RuskJeremy to be tested for hemoglobinopathy [ ] -order in 10/11          Term labor symptoms and general obstetric precautions including but not limited to vaginal bleeding, contractions, leaking of fluid and fetal movement were reviewed in detail with the patient. Please refer to After Visit Summary for other counseling recommendations.   Return in about 1 week (around 12/13/2018) for Routine  Prenatal Appointment.  Thomasene Mohair, MD, [facility], [facility] 12/06/2018 8:39 AM

## 2018-12-06 NOTE — Patient Instructions (Signed)
Go to the [facility] at [facility] on 12/14/2018 between 9 and 10 o'clock. Please drive up to the entrance of the building and stay in your car. Please wear a mask during this time. A sample will be taken to test you for the presence of the novel coronavirus at this time. If you do not hear anything back from your provider, the results were negative.  Otherwise, your provider will contact you with specific instructions.

## 2018-12-13 ENCOUNTER — Encounter: Payer: Self-pay | Admitting: Obstetrics and Gynecology

## 2018-12-13 ENCOUNTER — Ambulatory Visit (INDEPENDENT_AMBULATORY_CARE_PROVIDER_SITE_OTHER): Payer: Medicaid Other | Admitting: Obstetrics and Gynecology

## 2018-12-13 ENCOUNTER — Other Ambulatory Visit: Payer: Self-pay

## 2018-12-13 VITALS — BP 118/74 | Wt 169.0 lb

## 2018-12-13 DIAGNOSIS — D582 Other hemoglobinopathies: Secondary | ICD-10-CM

## 2018-12-13 DIAGNOSIS — Z3A39 39 weeks gestation of pregnancy: Secondary | ICD-10-CM

## 2018-12-13 DIAGNOSIS — O9989 Other specified diseases and conditions complicating pregnancy, childbirth and the puerperium: Secondary | ICD-10-CM

## 2018-12-13 DIAGNOSIS — Z3480 Encounter for supervision of other normal pregnancy, unspecified trimester: Secondary | ICD-10-CM

## 2018-12-13 NOTE — Progress Notes (Signed)
Routine Prenatal Care Visit  Subjective  Maria Gentry is a 23 y.o. G1P0000 at [redacted]w[redacted]d being seen today for ongoing prenatal care.  She is currently monitored for the following issues for this low-risk pregnancy and has Amenorrhea; Hemoglobin C trait (HCC); Infertility associated with anovulation; Infertility, female; Supervision of other normal pregnancy, antepartum; Low back pain; and Swelling of both lower extremities on their problem list.  ----------------------------------------------------------------------------------- Patient reports no complaints.   Contractions: Irritability. Vag. Bleeding: None.  Movement: Present. Denies leaking of fluid.  ----------------------------------------------------------------------------------- The following portions of the patient's history were reviewed and updated as appropriate: allergies, current medications, past family history, past medical history, past social history, past surgical history and problem list. Problem list updated.   Objective  Blood pressure 118/74, weight 169 lb (76.7 kg), last menstrual period 03/10/2018. Pregravid weight 136 lb (61.7 kg) Total Weight Gain 33 lb (15 kg) Urinalysis: Urine Protein    Urine Glucose    Fetal Status: Fetal Heart Rate (bpm): 140   Movement: Present  Presentation: Vertex  General:  Alert, oriented and cooperative. Patient is in no acute distress.  Skin: Skin is warm and dry. No rash noted.   Cardiovascular: Normal heart rate noted  Respiratory: Normal respiratory effort, no problems with respiration noted  Abdomen: Soft, gravid, appropriate for gestational age. Pain/Pressure: Absent     Pelvic:  Cervical exam performed Dilation: Closed Effacement (%): 30 Station: -3  Extremities: Normal range of motion.  Edema: Trace  Mental Status: Normal mood and affect. Normal behavior. Normal judgment and thought content.   Assessment   23 y.o. G1P0000 at [redacted]w[redacted]d by  12/15/2018, by Last Menstrual Period  presenting for routine prenatal visit  Plan   pregnancy Problems (from 04/18/18 to present)    Problem Noted Resolved   Supervision of other normal pregnancy, antepartum 04/18/2018 by Conard Novak, MD No   Overview Addendum 10/20/2018  9:32 AM by Farrel Conners, CNM    Clinic [facility] Prenatal Labs  Dating L=7 Blood type: B/Positive/-- (10/01 1548)   Genetic Screen 1 Screen: negative Inheritest: negative Antibody:Negative (10/01 1548)  Anatomic [place]  Rubella: 2.82 (10/01 1548) Varicella: Non-Immune  GTT Early:               Third trimester:  RPR: Non Reactive (10/01 1548)   Rhogam n/a HBsAg: Negative (10/01 1548)   TDaP vaccine                       Flu Shot: 06/02/18 HIV: Non Reactive (10/01 1548)   Baby Food        Breast                        GBS:   Contraception  Pap: 11/05/17 NIL  CBB     CS/VBAC    Support Person Maria Gentry           Hemoglobin C trait (HCC) 09/17/2016 by Conard Novak, MD No   Overview Addendum 08/25/2018  8:44 AM by Farrel Conners, CNM    Husband Maria Gentry to be tested for hemoglobinopathy [ ] -order in 10/11          Term labor symptoms and general obstetric precautions including but not limited to vaginal bleeding, contractions, leaking of fluid and fetal movement were reviewed in detail with the patient. Please refer to After Visit Summary for other counseling recommendations.   - labs previously entered for induction of labor scheduled for  5/30 (including SARS-CoV-2 testing).  Return if symptoms worsen or fail to improve.  Thomasene MohairStephen Lariza Cothron, MD, [facility], [facility] 12/13/2018 9:30 AM

## 2018-12-14 ENCOUNTER — Ambulatory Visit
Admission: RE | Admit: 2018-12-14 | Discharge: 2018-12-14 | Disposition: A | Discharge status: Home / Self Care | Payer: Medicaid Other | Source: Ambulatory Visit | Attending: Obstetrics and Gynecology | Admitting: Obstetrics and Gynecology

## 2018-12-14 ENCOUNTER — Other Ambulatory Visit: Payer: Self-pay

## 2018-12-14 DIAGNOSIS — Z1159 Encounter for screening for other viral diseases: Secondary | ICD-10-CM | POA: Diagnosis not present

## 2018-12-15 LAB — NOVEL CORONAVIRUS, NAA (HOSP ORDER, SEND-OUT TO REF LAB; TAT 18-24 HRS): SARS-CoV-2, NAA: NOT DETECTED

## 2018-12-16 ENCOUNTER — Inpatient Hospital Stay
Admission: RE | Admit: 2018-12-16 | Discharge: 2018-12-20 | DRG: 787 | Disposition: A | Discharge status: Home / Self Care | Payer: Medicaid Other | Attending: Obstetrics and Gynecology | Admitting: Obstetrics and Gynecology

## 2018-12-16 ENCOUNTER — Encounter: Payer: Self-pay | Admitting: *Deleted

## 2018-12-16 ENCOUNTER — Other Ambulatory Visit: Payer: Self-pay

## 2018-12-16 DIAGNOSIS — O9081 Anemia of the puerperium: Secondary | ICD-10-CM | POA: Diagnosis not present

## 2018-12-16 DIAGNOSIS — Z3480 Encounter for supervision of other normal pregnancy, unspecified trimester: Secondary | ICD-10-CM

## 2018-12-16 DIAGNOSIS — D582 Other hemoglobinopathies: Secondary | ICD-10-CM

## 2018-12-16 DIAGNOSIS — Z3A40 40 weeks gestation of pregnancy: Secondary | ICD-10-CM

## 2018-12-16 DIAGNOSIS — Z1159 Encounter for screening for other viral diseases: Secondary | ICD-10-CM

## 2018-12-16 DIAGNOSIS — D62 Acute posthemorrhagic anemia: Secondary | ICD-10-CM | POA: Diagnosis not present

## 2018-12-16 DIAGNOSIS — O480 Post-term pregnancy: Principal | ICD-10-CM | POA: Diagnosis present

## 2018-12-16 DIAGNOSIS — Z98891 History of uterine scar from previous surgery: Secondary | ICD-10-CM

## 2018-12-16 DIAGNOSIS — Z3490 Encounter for supervision of normal pregnancy, unspecified, unspecified trimester: Secondary | ICD-10-CM | POA: Diagnosis present

## 2018-12-16 LAB — CBC
HCT: 28.9 % — ABNORMAL LOW (ref 36.0–46.0)
Hemoglobin: 10.4 g/dL — ABNORMAL LOW (ref 12.0–15.0)
MCH: 29.3 pg (ref 26.0–34.0)
MCHC: 36 g/dL (ref 30.0–36.0)
MCV: 81.4 fL (ref 80.0–100.0)
Platelets: 216 10*3/uL (ref 150–400)
RBC: 3.55 MIL/uL — ABNORMAL LOW (ref 3.87–5.11)
RDW: 14.7 % (ref 11.5–15.5)
WBC: 8.3 10*3/uL (ref 4.0–10.5)
nRBC: 0 % (ref 0.0–0.2)

## 2018-12-16 LAB — TYPE AND SCREEN
ABO/RH(D): B POS
Antibody Screen: NEGATIVE

## 2018-12-16 MED ORDER — LACTATED RINGERS IV SOLN
500.0000 mL | INTRAVENOUS | Status: DC | PRN
  Administered 2018-12-17: 10:00:00 500 mL via INTRAVENOUS

## 2018-12-16 MED ORDER — ONDANSETRON HCL 4 MG/2ML IJ SOLN: 4.0000 mg | Freq: Four times a day (QID) | INTRAMUSCULAR | Status: DC | PRN

## 2018-12-16 MED ORDER — MISOPROSTOL 200 MCG PO TABS
ORAL_TABLET | ORAL | Status: AC
  Administered 2018-12-16: 11:00:00 25 ug via VAGINAL
  Filled 2018-12-16: qty 4

## 2018-12-16 MED ORDER — OXYTOCIN BOLUS FROM INFUSION: 500.0000 mL | Freq: Once | INTRAVENOUS | Status: DC

## 2018-12-16 MED ORDER — LACTATED RINGERS IV SOLN
INTRAVENOUS | Status: DC
  Administered 2018-12-16 – 2018-12-18 (×6): via INTRAVENOUS

## 2018-12-16 MED ORDER — SOD CITRATE-CITRIC ACID 500-334 MG/5ML PO SOLN
30.0000 mL | ORAL | Status: DC | PRN
  Filled 2018-12-16: qty 30

## 2018-12-16 MED ORDER — MISOPROSTOL 25 MCG QUARTER TABLET
25.0000 ug | ORAL_TABLET | ORAL | Status: DC | PRN
  Administered 2018-12-16: 11:00:00 25 ug via VAGINAL
  Filled 2018-12-16: qty 1

## 2018-12-16 MED ORDER — LIDOCAINE HCL (PF) 1 % IJ SOLN
30.0000 mL | INTRAMUSCULAR | Status: DC | PRN
  Filled 2018-12-16: qty 30

## 2018-12-16 MED ORDER — TERBUTALINE SULFATE 1 MG/ML IJ SOLN: 0.2500 mg | Freq: Once | INTRAMUSCULAR | Status: DC | PRN

## 2018-12-16 MED ORDER — OXYTOCIN 10 UNIT/ML IJ SOLN
10.0000 [IU] | Freq: Once | INTRAMUSCULAR | Status: DC
  Filled 2018-12-16: qty 1

## 2018-12-16 MED ORDER — OXYTOCIN 40 UNITS IN NORMAL SALINE INFUSION - SIMPLE MED
2.5000 [IU]/h | INTRAVENOUS | Status: DC
  Administered 2018-12-18: 400 mL via INTRAVENOUS
  Filled 2018-12-16 (×2): qty 1000

## 2018-12-16 MED ORDER — AMMONIA AROMATIC IN INHA
RESPIRATORY_TRACT | Status: AC
  Filled 2018-12-16: qty 10

## 2018-12-16 NOTE — Progress Notes (Signed)
Labor Check  Subj:  Complaints: more uncomfortable with contractions   Obj:  BP 126/72   Pulse 67   Temp 98.4 F (36.9 C) (Oral)   Resp 18   Ht 5' (1.524 m)   Wt 76.7 kg   LMP 03/10/2018   BMI 33.01 kg/m     Cervix: Dilation: 1 / Effacement (%): 60 / Station: -3   Foley bulb placed via sterile speculum exam without difficulty Baseline FHR: 125 beats/min   Variability: moderate   Accelerations: present   Decelerations: absent Contractions: present frequency: 4-5 q 10 minutes Overall assessment: category 1  A/P: 23 y.o. G1P0000 female at [redacted]w[redacted]d with IOL.  1.  Labor: She is status post 1 dose cytotec 12 hours ago. She is contracting too frequently to place another. Therefore, a foley balloon was placed without difficulty.  Will add pitocin if needs further medication for contractions.  2.  FWB: reassuring, Overall assessment: category 1  3.  GBS negative on 5/5  4.  Pain: prn, epidural whenever desires 5.  Recheck: prn   Thomasene Mohair, MD, [facility], [facility] 12/16/2018 11:03 PM

## 2018-12-16 NOTE — H&P (Signed)
OB History & Physical   History of Present Illness:  Chief Complaint: here for induction of labor  HPI:  Maria Gentry is a 23 y.o. G1P0000 female at [redacted]w[redacted]d dated by LMP consistent with a 6 week ultrasound.  Her pregnancy has been complicated by hemoglobin C trait (father of baby has not been tested), letrozole pregnancy, influenza during pregnancy.    She denies contractions.   She denies leakage of fluid.   She denies vaginal bleeding.   She reports fetal movement.    Maternal Medical History:   Past Medical History:  Diagnosis Date  . Amenorrhea   . Cyst of ovary   . History of imperforate hymen   . History of vaccination against human papillomavirus   . Irregular menses   . Medical history non-contributory     Past Surgical History:  Procedure Laterality Date  . EYE SURGERY    . HYMENECTOMY  12/2015    No Known Allergies  Prior to Admission medications   Medication Sig Start Date End Date Taking? Authorizing Provider  ferrous sulfate 325 (65 FE) MG EC tablet Take 325 mg by mouth every morning.   Yes [provider]  prenatal vitamin w/FE, FA (PRENATAL 1 + 1) 27-1 MG TABS tablet Take 1 tablet by mouth daily at 12 noon.   Yes [provider]    OB History  Gravida Para Term Preterm AB Living  1 0 0 0 0 0  SAB TAB Ectopic Multiple Live Births  0 0 0 0 0    # Outcome Date GA Lbr Len/2nd Weight Sex Delivery Anes PTL Lv  1 Current             Prenatal care site: [facility]  Social History: She  reports that she has never smoked. She has never used smokeless tobacco. She reports that she does not drink alcohol or use drugs.  Family History: family history includes Ovarian cancer in an other family member; Skin cancer in her mother.   Review of Systems: Negative x 10 systems reviewed except as noted in the HPI.    Physical Exam:  Vital Signs: BP 123/68 (BP Location: Left Arm)   Pulse 85   Temp 98.7 F (37.1 C) (Oral)   Resp 16   Ht 5'  (1.524 m)   Wt 76.7 kg   LMP 03/10/2018   BMI 33.01 kg/m  Constitutional: Well nourished, well developed female in no acute distress.  HEENT: normal Skin: Warm and dry.  Cardiovascular: Regular rate and rhythm.   Extremity: no edema  Respiratory: Clear to auscultation bilateral. Normal respiratory effort Abdomen: FHT present and gravid/NT Back: no CVAT Neuro: DTRs 2+, Cranial nerves grossly intact Psych: Alert and Oriented x3. No memory deficits. Normal mood and affect.  MS: normal gait, normal bilateral lower extremity ROM/strength/stability.  Pelvic exam: closed/thick/high per RN   Pertinent Results:  Prenatal Labs: Blood type/Rh B positive  Antibody screen negative  Rubella Immune  Varicella Not immune    RPR NR  HBsAg negative  HIV negative  GC negative  Chlamydia negative  Genetic screening 1st trimester screen negative, msAFP negative  1 hour GTT 119  3 hour GTT n/a  GBS negative on 11/21/2018   Baseline FHR: 135 beats/min   Variability: moderate   Accelerations: present   Decelerations: absent Contractions: absent  Overall assessment: cat 1  Bedside Ultrasound:  Number of Fetus: 1  Presentation: cephalic  Fluid: subjectively normal   Placental Location: posterior  Lab Results  Component Value Date   SARSCOV2NAA NOT DETECTED 12/14/2018     Assessment:  Maria Gentry is a 23 y.o. G1P0000 female at 7350w1d with IOL for dates.   Plan:  1. Admit to Labor & Delivery  2. CBC, T&S, Clrs, IVF 3. GBS negative.   4. Fetwal well-being: reassuring 5. Will start IOL with misoprostol. Discussed risks and benefits of induction and the various medications and techniques to affect birth, including risk of cesarean delivery. 6. Sars-CoV-2 negative by NAA.  Universal precautions.   Thomasene MohairStephen Maddux Vanscyoc, MD 12/16/2018 10:43 AM

## 2018-12-17 ENCOUNTER — Inpatient Hospital Stay: Payer: Medicaid Other | Admitting: Anesthesiology

## 2018-12-17 MED ORDER — FENTANYL CITRATE (PF) 100 MCG/2ML IJ SOLN
INTRAMUSCULAR | Status: AC
  Filled 2018-12-17: qty 2

## 2018-12-17 MED ORDER — OXYTOCIN 40 UNITS IN NORMAL SALINE INFUSION - SIMPLE MED: 1.0000 m[IU]/min | INTRAVENOUS | Status: DC

## 2018-12-17 MED ORDER — OXYTOCIN 40 UNITS IN NORMAL SALINE INFUSION - SIMPLE MED
1.0000 m[IU]/min | INTRAVENOUS | Status: DC
  Administered 2018-12-17: 1 m[IU]/min via INTRAVENOUS

## 2018-12-17 MED ORDER — PHENYLEPHRINE 40 MCG/ML (10ML) SYRINGE FOR IV PUSH (FOR BLOOD PRESSURE SUPPORT): 80.0000 ug | PREFILLED_SYRINGE | INTRAVENOUS | Status: DC | PRN

## 2018-12-17 MED ORDER — EPHEDRINE 5 MG/ML INJ: 10.0000 mg | INTRAVENOUS | Status: DC | PRN

## 2018-12-17 MED ORDER — FENTANYL CITRATE (PF) 100 MCG/2ML IJ SOLN
INTRAMUSCULAR | Status: DC | PRN
  Administered 2018-12-17: 100 ug via EPIDURAL
  Administered 2018-12-18: 10 ug via INTRATHECAL
  Administered 2018-12-18: 100 ug via EPIDURAL

## 2018-12-17 MED ORDER — LACTATED RINGERS IV SOLN: 500.0000 mL | Freq: Once | INTRAVENOUS | Status: DC

## 2018-12-17 MED ORDER — FENTANYL 2.5 MCG/ML W/ROPIVACAINE 0.15% IN NS 100 ML EPIDURAL (ARMC)
12.0000 mL/h | EPIDURAL | Status: DC
  Administered 2018-12-17 – 2018-12-18 (×3): 12 mL/h via EPIDURAL
  Filled 2018-12-17 (×2): qty 100

## 2018-12-17 MED ORDER — SODIUM CHLORIDE 0.9 % IV SOLN
INTRAVENOUS | Status: DC | PRN
  Administered 2018-12-17: 10 mL via EPIDURAL
  Administered 2018-12-17 (×3): 5 mL via EPIDURAL

## 2018-12-17 MED ORDER — LIDOCAINE HCL (PF) 1 % IJ SOLN
INTRAMUSCULAR | Status: DC | PRN
  Administered 2018-12-17: 2 mL

## 2018-12-17 MED ORDER — DIPHENHYDRAMINE HCL 50 MG/ML IJ SOLN: 12.5000 mg | INTRAMUSCULAR | Status: DC | PRN

## 2018-12-17 MED ORDER — FENTANYL 2.5 MCG/ML W/ROPIVACAINE 0.15% IN NS 100 ML EPIDURAL (ARMC)
EPIDURAL | Status: AC
  Filled 2018-12-17: qty 100

## 2018-12-17 MED ORDER — BUPIVACAINE HCL (PF) 0.25 % IJ SOLN
INTRAMUSCULAR | Status: DC | PRN
  Administered 2018-12-17: 10 mL via EPIDURAL

## 2018-12-17 MED ORDER — SODIUM CHLORIDE FLUSH 0.9 % IV SOLN
INTRAVENOUS | Status: AC
  Filled 2018-12-17: qty 10

## 2018-12-17 NOTE — Anesthesia Preprocedure Evaluation (Signed)
Anesthesia Evaluation  Patient identified by MRN, date of birth, ID band Patient awake    Reviewed: Allergy & Precautions, H&P , NPO status , Patient's Chart, lab work & pertinent test results  Airway Mallampati: III  TM Distance: >3 FB Neck ROM: full    Dental  (+) Teeth Intact   Pulmonary neg pulmonary ROS,           Cardiovascular Exercise Tolerance: Good (-) hypertensionnegative cardio ROS       Neuro/Psych    GI/Hepatic negative GI ROS,   Endo/Other    Renal/GU   negative genitourinary   Musculoskeletal   Abdominal   Peds  Hematology negative hematology ROS (+)   Anesthesia Other Findings Past Medical History: No date: Amenorrhea No date: Cyst of ovary No date: History of imperforate hymen No date: History of vaccination against human papillomavirus No date: Irregular menses No date: Medical history non-contributory  Past Surgical History: No date: EYE SURGERY 12/2015: HYMENECTOMY  BMI    Body Mass Index:  33.01 kg/m      Reproductive/Obstetrics (+) Pregnancy                             Anesthesia Physical Anesthesia Plan  ASA: II  Anesthesia Plan: Epidural   Post-op Pain Management:    Induction:   PONV Risk Score and Plan:   Airway Management Planned:   Additional Equipment:   Intra-op Plan:   Post-operative Plan:   Informed Consent: I have reviewed the patients History and Physical, chart, labs and discussed the procedure including the risks, benefits and alternatives for the proposed anesthesia with the patient or authorized representative who has indicated his/her understanding and acceptance.       Plan Discussed with: Anesthesiologist  Anesthesia Plan Comments:         Anesthesia Quick Evaluation

## 2018-12-17 NOTE — Progress Notes (Signed)
Labor Check  Subj:  Complaints: comfortable c epidural   Obj:  BP 122/68   Pulse 77   Temp 98.4 F (36.9 C) (Oral)   Resp 18   Ht 5' (1.524 m)   Wt 76.7 kg   LMP 03/10/2018   SpO2 97%   BMI 33.01 kg/m  Dose (milli-units/min) Oxytocin: 13 milli-units/min  Cervix: Dilation: 4.5 / Effacement (%): 70 / Station: -2   AROM: clear fluid  IUPC Placed s difficulty Baseline FHR: 135 beats/min   Variability: moderate   Accelerations: present   Decelerations: absent Contractions: present frequency: 3 q 10 minutes Overall assessment: cat 1  A/P: 23 y.o. G1P0000 female at [redacted]w[redacted]d with IOL.  1.  Labor: continue pitocin per protocol, AROM with clear fluid, IUPC placed  2.  FWB: reassuring, Overall assessment: category 1  3.  GBS neg  4.  Pain: epidural 5.  Recheck: Q 2 hours or PRN   Thomasene Mohair, MD, [facility], [facility] 12/17/2018 12:09 PM

## 2018-12-17 NOTE — Progress Notes (Signed)
Labor Check  Subj:  Complaints: more comfortable p adjustment to medication   Obj:  BP 139/85   Pulse 93   Temp 98.6 F (37 C) (Oral)   Resp 16   Ht 5' (1.524 m)   Wt 76.7 kg   LMP 03/10/2018   SpO2 100%   BMI 33.01 kg/m  Dose (milli-units/min) Oxytocin: 33 milli-units/min  Cervix: Dilation: 5 / Effacement (%): 90, 100 / Station: -1, 0  Baseline FHR: 135 beats/min   Variability: moderate   Accelerations: present   Decelerations: present (one late appearing deceleration, but with moderate variability and has not recurred. She has also had accelerations afterward).  Contractions: present frequency: 3-4 q 10 min Overall assessment: cat 1 overall  A/P: 23 y.o. G1P0000 female at [redacted]w[redacted]d with IOL.  1.  Labor: continue pitocin at current level.  Have considered discussed primary cesarean as it has nearly been 12 hours since AROM while patient on pitocin with no real change in all that time. However, this exam was different. She was called 5 cm, but could be stretched to 5.5 cm. Also, the head was much lower with current station at -1 to 0. The effacement was also nearly 100%, which is a change. Discussed with patient and her husband and they both agree to monitor. However, if her dilation stops, will consider c-section as she has not quite reached 6 cm.  2.  FWB: reassuring, Overall assessment: category 1  3.  GBS negative  4.  Pain: epidural 5.  Recheck: 2 hours/prn   Thomasene Mohair, MD, [facility], [facility] 12/17/2018 10:58 PM

## 2018-12-17 NOTE — Progress Notes (Signed)
Labor Check  Subj:  Complaints: uncomfortable, but tolerating the contractions   Obj:  BP 118/84   Pulse 82   Temp 98.4 F (36.9 C) (Oral)   Resp 18   Ht 5' (1.524 m)   Wt 76.7 kg   LMP 03/10/2018   BMI 33.01 kg/m  Dose (milli-units/min) Oxytocin: 6 milli-units/min  Cervix: Dilation: 4 / Effacement (%): 80 / Station: Ballotable  Baseline FHR: 130 beats/min   Variability: moderate   Accelerations: present   Decelerations: absent Contractions: present frequency: 3 q 10 miin Overall assessment: category 1  A/P: 23 y.o. G1P0000 female at [redacted]w[redacted]d with IOL.  1.  Labor: continue pitocin per protocol just bumped up to 6  2.  FWB: reassuring, Overall assessment: category 1  3.  GBS negative  4.  Pain: prn, epidural if desires 5.  Recheck: 2 hours/prn   Thomasene Mohair, MD, [facility], [facility] 12/17/2018 9:28 AM

## 2018-12-17 NOTE — Anesthesia Procedure Notes (Signed)
Epidural Patient location during procedure: OB Start time: 12/17/2018 10:10 AM End time: 12/17/2018 10:20 AM  Staffing Anesthesiologist: Jovita Gamma, MD Performed: anesthesiologist   Preanesthetic Checklist Completed: patient identified, site marked, surgical consent, pre-op evaluation, timeout performed, IV checked, risks and benefits discussed and monitors and equipment checked  Epidural Patient position: sitting Prep: Betadine Patient monitoring: heart rate, continuous pulse ox and blood pressure Approach: midline Location: L4-L5 Injection technique: LOR saline  Needle:  Needle type: Tuohy  Needle gauge: 18 G Needle length: 9 cm and 9 Needle insertion depth: 4.5 cm Catheter type: closed end flexible Catheter size: 20 Guage Catheter at skin depth: 9 cm Test dose: negative and Other  Assessment Events: blood not aspirated, injection not painful, no injection resistance, negative IV test and no paresthesia  Additional Notes   Patient tolerated the insertion well without complications.Reason for block:procedure for pain

## 2018-12-18 ENCOUNTER — Other Ambulatory Visit: Payer: Self-pay | Admitting: Pediatrics

## 2018-12-18 ENCOUNTER — Encounter
Admission: RE | Disposition: A | Discharge status: Home / Self Care | Payer: Self-pay | Source: Home / Self Care | Attending: Obstetrics and Gynecology

## 2018-12-18 DIAGNOSIS — Z98891 History of uterine scar from previous surgery: Secondary | ICD-10-CM

## 2018-12-18 LAB — CBC
HCT: 31.3 % — ABNORMAL LOW (ref 36.0–46.0)
Hemoglobin: 11.4 g/dL — ABNORMAL LOW (ref 12.0–15.0)
MCH: 29.2 pg (ref 26.0–34.0)
MCHC: 36.4 g/dL — ABNORMAL HIGH (ref 30.0–36.0)
MCV: 80.3 fL (ref 80.0–100.0)
Platelets: 230 10*3/uL (ref 150–400)
RBC: 3.9 MIL/uL (ref 3.87–5.11)
RDW: 14.9 % (ref 11.5–15.5)
WBC: 23.5 10*3/uL — ABNORMAL HIGH (ref 4.0–10.5)
nRBC: 0 % (ref 0.0–0.2)

## 2018-12-18 LAB — RPR: RPR Ser Ql: NONREACTIVE

## 2018-12-18 SURGERY — Surgical Case
Anesthesia: General

## 2018-12-18 MED ORDER — DIPHENHYDRAMINE HCL 50 MG/ML IJ SOLN: 12.5000 mg | INTRAMUSCULAR | Status: DC | PRN

## 2018-12-18 MED ORDER — DIPHENHYDRAMINE HCL 25 MG PO CAPS: 25.0000 mg | ORAL_CAPSULE | ORAL | Status: DC | PRN

## 2018-12-18 MED ORDER — SCOPOLAMINE 1 MG/3DAYS TD PT72: 1.0000 | MEDICATED_PATCH | Freq: Once | TRANSDERMAL | Status: DC

## 2018-12-18 MED ORDER — LACTATED RINGERS IV SOLN: INTRAVENOUS | Status: DC

## 2018-12-18 MED ORDER — ACETAMINOPHEN 325 MG PO TABS
ORAL_TABLET | ORAL | Status: AC
  Filled 2018-12-18: qty 1

## 2018-12-18 MED ORDER — SODIUM CHLORIDE 0.9 % IV SOLN
500.0000 mg | INTRAVENOUS | Status: AC
  Administered 2018-12-18: 500 mg via INTRAVENOUS
  Filled 2018-12-18: qty 500

## 2018-12-18 MED ORDER — MENTHOL 3 MG MT LOZG
1.0000 | LOZENGE | OROMUCOSAL | Status: DC | PRN
  Filled 2018-12-18: qty 9

## 2018-12-18 MED ORDER — NALBUPHINE HCL 10 MG/ML IJ SOLN: 5.0000 mg | INTRAMUSCULAR | Status: DC | PRN

## 2018-12-18 MED ORDER — EPHEDRINE SULFATE 50 MG/ML IJ SOLN
INTRAMUSCULAR | Status: DC | PRN
  Administered 2018-12-18: 7.5 mg via INTRAVENOUS
  Administered 2018-12-18: 5 mg via INTRAVENOUS

## 2018-12-18 MED ORDER — SODIUM CHLORIDE 0.9% FLUSH: 3.0000 mL | INTRAVENOUS | Status: DC | PRN

## 2018-12-18 MED ORDER — CARBOPROST TROMETHAMINE 250 MCG/ML IM SOLN
INTRAMUSCULAR | Status: AC
  Filled 2018-12-18: qty 1

## 2018-12-18 MED ORDER — METHYLERGONOVINE MALEATE 0.2 MG PO TABS
0.2000 mg | ORAL_TABLET | Freq: Three times a day (TID) | ORAL | Status: DC
  Administered 2018-12-18 – 2018-12-19 (×5): 0.2 mg via ORAL
  Filled 2018-12-18 (×5): qty 1

## 2018-12-18 MED ORDER — MORPHINE SULFATE (PF) 0.5 MG/ML IJ SOLN
INTRAMUSCULAR | Status: AC
  Filled 2018-12-18: qty 10

## 2018-12-18 MED ORDER — KETOROLAC TROMETHAMINE 15 MG/ML IJ SOLN
15.0000 mg | Freq: Once | INTRAMUSCULAR | Status: AC | PRN
  Administered 2018-12-18: 15 mg via INTRAVENOUS

## 2018-12-18 MED ORDER — SODIUM CHLORIDE 0.9 % IV SOLN
INTRAVENOUS | Status: DC | PRN
  Administered 2018-12-18: 30 ug/min via INTRAVENOUS

## 2018-12-18 MED ORDER — NALBUPHINE HCL 10 MG/ML IJ SOLN: 5.0000 mg | Freq: Once | INTRAMUSCULAR | Status: DC | PRN

## 2018-12-18 MED ORDER — KETOROLAC TROMETHAMINE 15 MG/ML IJ SOLN
INTRAMUSCULAR | Status: AC
  Filled 2018-12-18: qty 1

## 2018-12-18 MED ORDER — DEXAMETHASONE SODIUM PHOSPHATE 10 MG/ML IJ SOLN
INTRAMUSCULAR | Status: DC | PRN
  Administered 2018-12-18: 5 mg via INTRAVENOUS

## 2018-12-18 MED ORDER — CEFAZOLIN SODIUM-DEXTROSE 2-4 GM/100ML-% IV SOLN
2.0000 g | INTRAVENOUS | Status: DC
  Filled 2018-12-18: qty 100

## 2018-12-18 MED ORDER — ONDANSETRON HCL 4 MG/2ML IJ SOLN: 4.0000 mg | Freq: Three times a day (TID) | INTRAMUSCULAR | Status: DC | PRN

## 2018-12-18 MED ORDER — SENNOSIDES-DOCUSATE SODIUM 8.6-50 MG PO TABS
2.0000 | ORAL_TABLET | ORAL | Status: DC
  Administered 2018-12-19 – 2018-12-20 (×2): 2 via ORAL
  Filled 2018-12-18 (×2): qty 2

## 2018-12-18 MED ORDER — LIDOCAINE HCL (PF) 2 % IJ SOLN
INTRAMUSCULAR | Status: DC | PRN
  Administered 2018-12-18 (×4): 5 mg via EPIDURAL

## 2018-12-18 MED ORDER — PROPOFOL 10 MG/ML IV BOLUS
INTRAVENOUS | Status: DC | PRN
  Administered 2018-12-18: 100 mg via INTRAVENOUS

## 2018-12-18 MED ORDER — FENTANYL CITRATE (PF) 100 MCG/2ML IJ SOLN
INTRAMUSCULAR | Status: AC
  Filled 2018-12-18: qty 2

## 2018-12-18 MED ORDER — DIBUCAINE (PERIANAL) 1 % EX OINT: 1.0000 "application " | TOPICAL_OINTMENT | CUTANEOUS | Status: DC | PRN

## 2018-12-18 MED ORDER — SIMETHICONE 80 MG PO CHEW
80.0000 mg | CHEWABLE_TABLET | Freq: Three times a day (TID) | ORAL | Status: DC
  Administered 2018-12-18 – 2018-12-20 (×6): 80 mg via ORAL
  Filled 2018-12-18 (×6): qty 1

## 2018-12-18 MED ORDER — ACETAMINOPHEN 500 MG PO TABS
1000.0000 mg | ORAL_TABLET | Freq: Four times a day (QID) | ORAL | Status: DC
  Administered 2018-12-18 (×3): 1000 mg via ORAL
  Filled 2018-12-18 (×3): qty 2

## 2018-12-18 MED ORDER — MORPHINE SULFATE (PF) 0.5 MG/ML IJ SOLN
INTRAMUSCULAR | Status: DC | PRN
  Administered 2018-12-18: .1 mg via INTRATHECAL

## 2018-12-18 MED ORDER — DIPHENHYDRAMINE HCL 25 MG PO CAPS: 25.0000 mg | ORAL_CAPSULE | Freq: Four times a day (QID) | ORAL | Status: DC | PRN

## 2018-12-18 MED ORDER — BUPIVACAINE HCL (PF) 0.5 % IJ SOLN: 5.0000 mL | Freq: Once | INTRAMUSCULAR | Status: DC

## 2018-12-18 MED ORDER — WITCH HAZEL-GLYCERIN EX PADS: 1.0000 "application " | MEDICATED_PAD | CUTANEOUS | Status: DC | PRN

## 2018-12-18 MED ORDER — EPHEDRINE SULFATE 50 MG/ML IJ SOLN: 50.0000 mg | Freq: Once | INTRAMUSCULAR | Status: DC | PRN

## 2018-12-18 MED ORDER — PRENATAL MULTIVITAMIN CH
1.0000 | ORAL_TABLET | Freq: Every day | ORAL | Status: DC
  Administered 2018-12-18 – 2018-12-20 (×3): 1 via ORAL
  Filled 2018-12-18 (×3): qty 1

## 2018-12-18 MED ORDER — FENTANYL CITRATE (PF) 100 MCG/2ML IJ SOLN: 25.0000 ug | INTRAMUSCULAR | Status: DC | PRN

## 2018-12-18 MED ORDER — METHYLERGONOVINE MALEATE 0.2 MG/ML IJ SOLN
INTRAMUSCULAR | Status: DC | PRN
  Administered 2018-12-18: 0.2 mg via INTRAMUSCULAR

## 2018-12-18 MED ORDER — BUPIVACAINE 0.25 % ON-Q PUMP DUAL CATH 400 ML
400.0000 mL | INJECTION | Status: DC
  Filled 2018-12-18: qty 400

## 2018-12-18 MED ORDER — BUPIVACAINE HCL (PF) 0.5 % IJ SOLN
5.0000 mL | Freq: Once | INTRAMUSCULAR | Status: DC
  Filled 2018-12-18: qty 30

## 2018-12-18 MED ORDER — FERROUS SULFATE 325 (65 FE) MG PO TABS
325.0000 mg | ORAL_TABLET | Freq: Two times a day (BID) | ORAL | Status: DC
  Administered 2018-12-18 – 2018-12-20 (×4): 325 mg via ORAL
  Filled 2018-12-18 (×4): qty 1

## 2018-12-18 MED ORDER — PROMETHAZINE HCL 25 MG/ML IJ SOLN: 6.2500 mg | INTRAMUSCULAR | Status: DC | PRN

## 2018-12-18 MED ORDER — VARICELLA VIRUS VACCINE LIVE 1350 PFU/0.5ML IJ SUSR
0.5000 mL | INTRAMUSCULAR | Status: AC | PRN
  Administered 2018-12-20: 0.5 mL via SUBCUTANEOUS
  Filled 2018-12-18: qty 0.5

## 2018-12-18 MED ORDER — NALOXONE HCL 4 MG/10ML IJ SOLN
1.0000 ug/kg/h | INTRAVENOUS | Status: DC | PRN
  Filled 2018-12-18: qty 5

## 2018-12-18 MED ORDER — ACETAMINOPHEN 500 MG PO TABS
1000.0000 mg | ORAL_TABLET | Freq: Once | ORAL | Status: AC | PRN
  Administered 2018-12-18: 04:00:00 1000 mg via ORAL

## 2018-12-18 MED ORDER — COCONUT OIL OIL
1.0000 "application " | TOPICAL_OIL | Status: DC | PRN
  Administered 2018-12-20: 1 via TOPICAL
  Filled 2018-12-18 (×2): qty 120

## 2018-12-18 MED ORDER — PROMETHAZINE HCL 25 MG/ML IJ SOLN
6.2500 mg | INTRAMUSCULAR | Status: DC | PRN
  Administered 2018-12-18: 07:00:00 6.25 mg via INTRAVENOUS

## 2018-12-18 MED ORDER — BUPIVACAINE HCL (PF) 0.75 % IJ SOLN
INTRAMUSCULAR | Status: DC | PRN
  Administered 2018-12-18: 1 mL

## 2018-12-18 MED ORDER — CEFAZOLIN SODIUM-DEXTROSE 2-3 GM-%(50ML) IV SOLR
INTRAVENOUS | Status: DC | PRN
  Administered 2018-12-18: 2 g via INTRAVENOUS

## 2018-12-18 MED ORDER — SUCCINYLCHOLINE CHLORIDE 20 MG/ML IJ SOLN
INTRAMUSCULAR | Status: DC | PRN
  Administered 2018-12-18: 100 mg via INTRAVENOUS

## 2018-12-18 MED ORDER — METHYLERGONOVINE MALEATE 0.2 MG/ML IJ SOLN
INTRAMUSCULAR | Status: AC
  Filled 2018-12-18: qty 1

## 2018-12-18 MED ORDER — KETOROLAC TROMETHAMINE 30 MG/ML IJ SOLN
15.0000 mg | Freq: Four times a day (QID) | INTRAMUSCULAR | Status: DC
  Administered 2018-12-18 (×3): 15 mg via INTRAVENOUS
  Filled 2018-12-18 (×3): qty 1

## 2018-12-18 MED ORDER — OXYTOCIN 40 UNITS IN NORMAL SALINE INFUSION - SIMPLE MED
2.5000 [IU]/h | INTRAVENOUS | Status: AC
  Administered 2018-12-18: 2.5 [IU]/h via INTRAVENOUS

## 2018-12-18 MED ORDER — NALOXONE HCL 0.4 MG/ML IJ SOLN: 0.4000 mg | INTRAMUSCULAR | Status: DC | PRN

## 2018-12-18 MED ORDER — OXYCODONE-ACETAMINOPHEN 5-325 MG PO TABS: 2.0000 | ORAL_TABLET | ORAL | Status: DC | PRN

## 2018-12-18 MED ORDER — SOD CITRATE-CITRIC ACID 500-334 MG/5ML PO SOLN: 30.0000 mL | ORAL | Status: DC

## 2018-12-18 MED ORDER — OXYCODONE-ACETAMINOPHEN 5-325 MG PO TABS
1.0000 | ORAL_TABLET | ORAL | Status: DC | PRN
  Administered 2018-12-19: 14:00:00 1 via ORAL
  Filled 2018-12-18 (×2): qty 1

## 2018-12-18 MED ORDER — IBUPROFEN 600 MG PO TABS
600.0000 mg | ORAL_TABLET | Freq: Four times a day (QID) | ORAL | Status: DC
  Administered 2018-12-19 – 2018-12-20 (×6): 600 mg via ORAL
  Filled 2018-12-18 (×6): qty 1

## 2018-12-18 MED ORDER — PHENYLEPHRINE HCL (PRESSORS) 10 MG/ML IV SOLN
INTRAVENOUS | Status: DC | PRN
  Administered 2018-12-18 (×3): 100 ug via INTRAVENOUS

## 2018-12-18 SURGICAL SUPPLY — 29 items
CANISTER SUCT 3000ML PPV (MISCELLANEOUS) ×2 IMPLANT
CATH KIT ON-Q SILVERSOAK 5IN (CATHETERS) ×4 IMPLANT
COVER WAND RF STERILE (DRAPES) ×2 IMPLANT
DERMABOND ADVANCED (GAUZE/BANDAGES/DRESSINGS) ×1
DERMABOND ADVANCED .7 DNX12 (GAUZE/BANDAGES/DRESSINGS) ×1 IMPLANT
DRSG OPSITE POSTOP 4X10 (GAUZE/BANDAGES/DRESSINGS) ×2 IMPLANT
DRSG TELFA 3X8 NADH (GAUZE/BANDAGES/DRESSINGS) ×2 IMPLANT
ELECT CAUTERY BLADE 6.4 (BLADE) ×2 IMPLANT
ELECT REM PT RETURN 9FT ADLT (ELECTROSURGICAL) ×2
ELECTRODE REM PT RTRN 9FT ADLT (ELECTROSURGICAL) ×1 IMPLANT
GAUZE SPONGE 4X4 12PLY STRL (GAUZE/BANDAGES/DRESSINGS) ×2 IMPLANT
GLOVE BIO SURGEON STRL SZ7 (GLOVE) ×2 IMPLANT
GLOVE INDICATOR 7.5 STRL GRN (GLOVE) ×2 IMPLANT
GOWN STRL REUS W/ TWL LRG LVL3 (GOWN DISPOSABLE) ×3 IMPLANT
GOWN STRL REUS W/TWL LRG LVL3 (GOWN DISPOSABLE) ×3
NS IRRIG 1000ML POUR BTL (IV SOLUTION) ×2 IMPLANT
PACK C SECTION AR (MISCELLANEOUS) ×2 IMPLANT
PAD OB MATERNITY 4.3X12.25 (PERSONAL CARE ITEMS) ×4 IMPLANT
PAD PREP 24X41 OB/GYN DISP (PERSONAL CARE ITEMS) ×2 IMPLANT
PENCIL SMOKE ULTRAEVAC 22 CON (MISCELLANEOUS) ×2 IMPLANT
STRIP CLOSURE SKIN 1/2X4 (GAUZE/BANDAGES/DRESSINGS) ×2 IMPLANT
SUT MNCRL 4-0 (SUTURE) ×1
SUT MNCRL 4-0 27XMFL (SUTURE) ×1
SUT PDS AB 1 TP1 96 (SUTURE) ×2 IMPLANT
SUT PLAIN GUT 0 (SUTURE) IMPLANT
SUT VIC AB 0 CTX 36 (SUTURE) ×2
SUT VIC AB 0 CTX36XBRD ANBCTRL (SUTURE) ×2 IMPLANT
SUTURE MNCRL 4-0 27XMF (SUTURE) ×1 IMPLANT
SWABSTK COMLB BENZOIN TINCTURE (MISCELLANEOUS) ×2 IMPLANT

## 2018-12-18 NOTE — Lactation Note (Addendum)
This note was copied from a baby's chart. Lactation Consultation Note  Patient Name: Maria Gentry Date: 12/18/2018 Reason for consult: Initial assessment;Mother's request;Primapara;Term  Mom having difficulty latching Maria Gentry on her own.  Assisted mom with comfortable position with pillow support in football hold initially on left breast skin to skin.  Mom has small nipples.  Demonstrated how to easily hand express lots of colostrum.  Mom reports have been leaking milk since about 20 weeks. Tea cup hold of breast seemed to work better to obtain latch.  Maria Gentry is a large baby and the football hold was difficult.  Changed to cradle hold.  Having difficulty getting Maria Gentry to open mouth wide or flange lips.  Sustaining latch longer in cradle hold, but still has tendency to curl in lower lip causing shallow latch.  Maria Gentry was on both breasts for over 30 minutes with approximately 20 minutes of nutritive sucking.  Reviewed supply and demand, normal course of lactation and routine newborn feeding patterns.  Encouraged mom to put Maria Gentry to the breast whenever he demonstrated feeding cues.  Lactation number written on white board and encouraged to call with any questions, concerns or assistance.  Maternal Data Formula Feeding for Exclusion: No Has patient been taught Hand Expression?: Yes Does the patient have breastfeeding experience prior to this delivery?: No  Feeding Feeding Type: Breast Fed  LATCH Score Latch: Repeated attempts needed to sustain latch, nipple held in mouth throughout feeding, stimulation needed to elicit sucking reflex.  Audible Swallowing: A few with stimulation(Small nipples)  Type of Nipple: Everted at rest and after stimulation  Comfort (Breast/Nipple): Soft / non-tender  Hold (Positioning): Assistance needed to correctly position infant at breast and maintain latch.  LATCH Score: 7  Interventions Interventions: Breast feeding basics reviewed;Assisted with  latch;Skin to skin;Breast massage;Hand express;Reverse pressure;Breast compression;Adjust position;Support pillows;Position options  Lactation Tools Discussed/Used WIC Program: Yes   Consult Status Consult Status: Follow-up Follow-up type: Call as needed    Maria Gentry 12/18/2018, 2:57 PM

## 2018-12-18 NOTE — Progress Notes (Signed)
Patient again with no change in the interval. She has been on pitocin for > 12 hours after amniotomy.  She has never been quite adequate from an MVU standpoint despite increasing her pitocin rate into the 30's.   With a failed induction, the patient and her husband are anxious to proceed with abdominal delivery. I personally consented the patient for cesarean section with the risks and benefits discussed and all questions answered.    Fetal status is reassuring at this time.  Will move to OR when able.  Thomasene Mohair, MD, [facility], [facility] 12/18/2018 1:18 AM

## 2018-12-18 NOTE — Anesthesia Post-op Follow-up Note (Signed)
  Anesthesia Pain Follow-up Note  Patient: Maria Gentry  Day #: 1  Date of Follow-up: 12/18/2018 Time: 7:49 AM  Last Vitals:  Vitals:   12/18/18 0510 12/18/18 0600  BP: 120/80 124/79  Pulse: 86   Resp: 20 20  Temp: 37.2 C 36.9 C  SpO2: 96%     Level of Consciousness: alert  Pain: none   Side Effects:None  Catheter Site Exam:clean, dry, no drainage     Plan: D/C from anesthesia care at surgeon's request  Starling Manns

## 2018-12-18 NOTE — Progress Notes (Signed)
POD#0 pLTCS Subjective:  Sitting up in bed, feeling better after some rest. Post-operative nausea has resolved, and she plans to try a small solid meal of rice soon. Has tolerated liquids well. Foley in place with adequate urine output. SCDs on. Denies pain.  Objective:  Blood pressure 122/77, pulse 75, temperature 99.7 F (37.6 C), temperature source Oral, resp. rate 18, height 5' (1.524 m), weight 76.7 kg, last menstrual period 03/10/2018, SpO2 96 %, currently breastfeeding.  General: NAD Pulmonary: no increased work of breathing Abdomen: non-distended, non-tender, fundus firm, lochia appropriate Incision: Dressing C/D/I, On-Q in place Extremities: no signs of DVT  Results for orders placed or performed during the hospital encounter of 12/16/18 (from the past 72 hour(s))  CBC     Status: Abnormal   Collection Time: 12/16/18  9:32 AM  Result Value Ref Range   WBC 8.3 4.0 - 10.5 K/uL   RBC 3.55 (L) 3.87 - 5.11 MIL/uL   Hemoglobin 10.4 (L) 12.0 - 15.0 g/dL   HCT 27.0 (L) 62.3 - 76.2 %   MCV 81.4 80.0 - 100.0 fL   MCH 29.3 26.0 - 34.0 pg   MCHC 36.0 30.0 - 36.0 g/dL   RDW 83.1 51.7 - 61.6 %   Platelets 216 150 - 400 K/uL   nRBC 0.0 0.0 - 0.2 %    Comment: Performed at [facility], [address] [place], [place] [address]  RPR     Status: None   Collection Time: 12/16/18  9:32 AM  Result Value Ref Range   RPR Ser Ql Non Reactive Non Reactive    Comment: (NOTE) Performed At: [facility] [address] [place], [place] [id number] Jolene Schimke MD OE:[id number]   Type and screen     Status: None   Collection Time: 12/16/18  9:32 AM  Result Value Ref Range   ABO/RH(D) B POS    Antibody Screen NEG    Sample Expiration      12/19/2018,2359 Performed at [facility], [address] [place], [place] [address]   CBC     Status: Abnormal   Collection Time: 12/18/18  6:11 AM  Result Value Ref Range   WBC 23.5 (H) 4.0 - 10.5 K/uL   RBC 3.90 3.87 -  5.11 MIL/uL   Hemoglobin 11.4 (L) 12.0 - 15.0 g/dL   HCT 71.6 (L) 96.7 - 89.3 %   MCV 80.3 80.0 - 100.0 fL   MCH 29.2 26.0 - 34.0 pg   MCHC 36.4 (H) 30.0 - 36.0 g/dL   RDW 81.0 17.5 - 10.2 %   Platelets 230 150 - 400 K/uL   nRBC 0.0 0.0 - 0.2 %    Comment: Performed at [facility], [address] [place], [place] [address]     Assessment:   23 y.o. G1P1001 post-operativeday #0 recovering well.   Plan:  1) Acute blood loss anemia - hemodynamically stable and asymptomatic - PO ferrous sulfate and prenatal vitamins with iron  2) Blood Type --/--/B POS (05/30 0932) / Rubella 2.82 (10/01 1548) / Varicella non-immune, vaccine has been ordered  3) TDAP status: received antepartum 10/06/2018  4) Breastfeeding  5) Contraception: not discussed  6) Disposition: continue postpartum care, plan for Foley removal/ambulation later today.  Marcelyn Bruins, CNM 12/18/2018  11:36 AM

## 2018-12-18 NOTE — Lactation Note (Signed)
This note was copied from a baby's chart. Lactation Consultation Note  Patient Name: Maria Gentry HTXHF'[id number] Date: 12/18/2018 Reason for consult: Follow-up assessment;Mother's request;Difficult latch;Primapara;Term;Other (Comment)(Cannot get [name] to open mouth wide enough for deep latch) Mom called for breast feeding assistance.  When we first put him to the breast, he started gagging.  Could not get [name] to wake up enough or to open mouth wide enough or flange lower lip outward to obtain effective latch after several attempts skin to skin.  Hand expressed 8 ml and spoon fed.  Shortly after feeding, [name] spit most of this feeding back up.  Encouraged mom to keep putting him to the breast anytime he demonstrated feeding cues.  Discouraged mom from giving bottle of formula and instead hand express and give her milk preferrably not via bottle when possible if no or poor breast feed.    Maternal Data Formula Feeding for Exclusion: No Has patient been taught Hand Expression?: Yes(Can easily hand express lots of colostrum) Does the patient have breastfeeding experience prior to this delivery?: No  Feeding Feeding Type: Breast Fed  LATCH Score Latch: Too sleepy or reluctant, no latch achieved, no sucking elicited.  Audible Swallowing: None(Swallowed what was spoon fed, but then spit back up)  Type of Nipple: Everted at rest and after stimulation(Small nipples)  Comfort (Breast/Nipple): Soft / non-tender  Hold (Positioning): Full assist, staff holds infant at breast  LATCH Score: 4  Interventions Interventions: Assisted with latch;Skin to skin;Breast massage;Hand express;Reverse pressure;Breast compression;Adjust position;Support pillows;Position options;Expressed milk(Spoon fed - mom has own Motherlove nipple ointment)  Lactation Tools Discussed/Used Tools: Nipple Dorris Carnes;Other (comment)(Spoonfed - Mom has own Motherlove nipple ointment) Nipple shield size: 16 WIC Program:  Yes   Consult Status Consult Status: Follow-up Follow-up type: Call as needed    [name] 12/18/2018, 7:47 PM

## 2018-12-18 NOTE — Discharge Summary (Addendum)
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                                                               Post partum procedures:none  Augmentation: AROM, Pitocin, Cytotec and Foley Balloon  Complications: None  Hospital course:  Induction of Labor With Cesarean Section  23 y.o. yo G1P0000 at [redacted]w[redacted]d was admitted to the hospital 12/16/2018 for induction of labor. Patient had a labor course significant for induction of labor with cytotec and Foley Balloon. She was converted to pitocin and eventually had AROM.  She was on pitocin at > 30 mU/min for greater than 12 hours after amniotomy and she had no significant cervical change. The patient went for cesarean section due to failed induction of labor, and delivered a Viable infant,12/18/2018  Membrane Rupture Time/Date: 11:55 AM ,12/17/2018   Details of operation can be found in separate operative Note.  Patient had an uncomplicated postpartum course. She is ambulating, tolerating a regular diet, passing flatus, and urinating well.  Patient is discharged home in stable condition on 12/20/18.                                    Physical exam  Vitals:   12/19/18 1014 12/19/18 1500 12/19/18 2324 12/20/18 0758  BP: 112/68 110/68 117/80 117/70  Pulse: 80 80 72 71  Resp: 18 18 18 18   Temp: 98.6 F (37 C) 98.3 F (36.8 C) 98.6 F (37 C) 98.9 F (37.2 C)  TempSrc: Oral  Oral Oral  SpO2: 98% 99% 100% 98%  Weight:      Height:       General: alert, cooperative and no distress Lochia:  appropriate Uterine Fundus: firm Incision: Dressing is clean, dry, and intact DVT Evaluation: No evidence of DVT seen on physical exam.  Labs: Lab Results  Component Value Date   WBC 16.0 (H) 12/19/2018   HGB 8.9 (L) 12/19/2018   HCT 24.6 (L) 12/19/2018   MCV 80.9 12/19/2018   PLT 207 12/19/2018    Discharge instruction: per After Visit Summary.  Medications:  Allergies as of 12/20/2018   No Known Allergies     Medication List    TAKE these medications   ferrous sulfate 325 (65 FE) MG EC tablet Take 325 mg by mouth every morning.   norethindrone 0.35 MG tablet Commonly known as:  MICRONOR Take 1 tablet (0.35 mg total) by mouth daily. Start taking on:  December 31, 2018   oxyCODONE 5 MG immediate release tablet Commonly known as:  Roxicodone Take 1 tablet (5 mg total) by mouth every 6 (six) hours as needed for up to 5 days for severe pain.   prenatal vitamin w/FE, FA 27-1 MG Tabs tablet Take 1 tablet by  mouth daily at 12 noon.            Discharge Care Instructions  (From admission, onward)         Start     Ordered   12/20/18 0000  Discharge wound care:    Comments:  Keep incision dry, clean.   12/20/18 1119          Diet: routine diet  Activity: Advance as tolerated. Pelvic rest for 6 weeks.   Outpatient follow up: Follow-up Information    Conard NovakJackson, Stephen D, MD. Go in 1 week(s).   Specialty:  Obstetrics and Gynecology Why:  Post-op incision check Contact information: [address] [place] [address] [phone]             Postpartum contraception: Progesterone only pills Rhogam Given postpartum: no Rubella vaccine given postpartum: no Varicella vaccine given postpartum: yes TDaP given antepartum or postpartum: AP on 10/06/2018  Newborn Data: Live born female  Birth Weight: 8 lb 10.6 oz (3930 g) APGAR: 9, 9  Newborn Delivery   Birth date/time:  12/18/2018 03:00:00 Delivery type:  C-Section, Low Transverse Trial of labor:   Yes C-section categorization:  Primary      Baby Feeding: Breast and formula  Disposition:home with mother  SIGNED: Parke PoissonJane E. Desarae Placide, CNM [facility] 12/20/2018, 11:20 AM

## 2018-12-18 NOTE — Anesthesia Postprocedure Evaluation (Signed)
Anesthesia Post Note  Patient: Maria Gentry  Procedure(s) Performed: CESAREAN SECTION (N/A )  Patient location during evaluation: Mother Baby Anesthesia Type: General Level of consciousness: oriented and awake and alert Pain management: pain level controlled Vital Signs Assessment: post-procedure vital signs reviewed and stable Respiratory status: spontaneous breathing and respiratory function stable Cardiovascular status: blood pressure returned to baseline and stable Postop Assessment: no headache, no backache, no apparent nausea or vomiting and able to ambulate Anesthetic complications: no     Last Vitals:  Vitals:   12/18/18 0510 12/18/18 0600  BP: 120/80 124/79  Pulse: 86   Resp: 20 20  Temp: 37.2 C 36.9 C  SpO2: 96%     Last Pain:  Vitals:   12/18/18 0600  TempSrc: Oral  PainSc: 0-No pain                 Starling Manns

## 2018-12-18 NOTE — Op Note (Signed)
Cesarean Section Operative Note    Maria Gentry   12/18/2018   Pre-operative Diagnosis:  1) Failed Induction of Labor 2) intrauterine pregnancy at 398w3d   Post-operative Diagnosis:  1) Failed Induction of Labor 2) intrauterine pregnancy at 648w3d    Procedure: Primary Low Transverse Cesarean Section via Pfannenstiel incision with double layer uterine closure  Surgeon: Surgeon(s) and Role:    Conard Novak* Jackson, Stephen D, MD - Primary  Assistants: Dr. Leeroy Bockhelsea Ward; No other capable assistant available, in surgery requiring high level assistant.  Anesthesia: general   Findings:  1) normal appearing gravid uterus, fallopian tubes, and ovaries 2) viable female infant with weight of 8 lb 11 oz   Estimated Blood Loss: 600 mL  Total IV Fluids: 950 ml   Urine Output: 50 mL  Specimens: none  Complications: no complications  Disposition: PACU - hemodynamically stable.   Maternal Condition: stable   Baby condition / location:  Couplet care / Skin to Skin  Procedure Details:  The patient was seen in the Holding Room. The risks, benefits, complications, treatment options, and expected outcomes were discussed with the patient. The patient concurred with the proposed plan, giving informed consent. identified as Maria Gentry and the procedure verified as C-Section Delivery. A Time Out was held and the above information confirmed.   Given that decision was made for general anesthesia and while patient was prepped for intubation, the abdomen was prepped and draped in the usual sterile fashion. A Pfannenstiel incision was made and carried down through the subcutaneous tissue to the fascia. Fascial incision was made and extended transversely. The fascia was separated from the underlying rectus tissue superiorly and inferiorly. The peritoneum was identified and entered. Peritoneal incision was extended longitudinally. The bladder flap was not bluntly or sharply freed from the lower uterine  segment. A low transverse uterine incision was made and the hysterotomy was extended with cranial-caudal tension. Delivered from cephalic presentation was a 3,930 gram Living newborn infant(s) or Female with Apgar scores of 9 at one minute and 9 at five minutes. Cord ph was not sent the umbilical cord was clamped and cut cord blood was not obtained for evaluation. The placenta was removed Intact and appeared normal. The uterine outline, tubes and ovaries appeared normal. The uterine incision was closed with running locked sutures of 0 Vicryl.  A second layer of the same suture was thrown in an imbricating fashion.  Hemostasis was assured.  The uterus was returned to the abdomen and the paracolic gutters were cleared of all clots and debris.  The rectus muscles were inspected and found to be hemostatic.  The On-Q catheter pumps were inserted in accordance with the manufacturer's recommendations.  The catheters were inserted approximately 4cm cephelad to the incision line, approximately 1cm apart, straddling the midline.  They were inserted to a depth of the 4th mark. They were positioned superficial to the rectus abdominus muscles and deep to the rectus fascia.    The fascia was then reapproximated with running sutures of 1-0 PDS, looped. The subcutaneous tissue was closed using a long-running 3-0 vicryl to re-approximate dead space and reinforce the skin closure.  The subcuticular closure was performed using 4-0 monocryl. The skin closure was reinforced using surgical skin glue.  The On-Q catheters were bolused with 5 mL of 0.5% marcaine plain for a total of 10 mL.  The catheters were affixed to the skin with surgical skin glue, steri-strips, and tegaderm.    Instrument, sponge, and needle  counts were correct prior the abdominal closure and were correct at the conclusion of the case.  The patient received Ancef 2 gram IV and Azithromycin 500 mg IV prior to skin incision (within 30 minutes). For VTE prophylaxis  she was wearing SCDs throughout the case.  The assistant surgeon was an MD due to lack of availability of another Sales promotion account executive.   Signed: Conard Novak, MD 12/18/2018 3:59 AM

## 2018-12-18 NOTE — Anesthesia Procedure Notes (Signed)
Procedure Name: Intubation Date/Time: 12/18/2018 2:57 AM Performed by: Allean Found, CRNA Pre-anesthesia Checklist: Patient identified, Patient being monitored, Timeout performed, Emergency Drugs available and Suction available Patient Re-evaluated:Patient Re-evaluated prior to induction Oxygen Delivery Method: Circle system utilized Preoxygenation: Pre-oxygenation with 100% oxygen Induction Type: IV induction Ventilation: Mask ventilation without difficulty Laryngoscope Size: Mac and 3 Grade View: Grade I Tube type: Oral Tube size: 7.0 mm Number of attempts: 1 Airway Equipment and Method: Stylet Placement Confirmation: ETT inserted through vocal cords under direct vision,  positive ETCO2 and breath sounds checked- equal and bilateral Secured at: 21 cm Tube secured with: Tape Dental Injury: Teeth and Oropharynx as per pre-operative assessment

## 2018-12-18 NOTE — Transfer of Care (Signed)
Immediate Anesthesia Transfer of Care Note  Patient: Maria Gentry  Procedure(s) Performed: CESAREAN SECTION (N/A )  Patient Location: PACU  Anesthesia Type:General  Level of Consciousness: awake, alert  and oriented  Airway & Oxygen Therapy: Patient Spontanous Breathing and Patient connected to nasal cannula oxygen  Post-op Assessment: Report given to RN and Post -op Vital signs reviewed and stable  Post vital signs: Reviewed and stable  Last Vitals:  Vitals Value Taken Time  BP 123/70 12/18/2018  4:10 AM  Temp    Pulse 97 12/18/2018  4:11 AM  Resp 22 12/18/2018  4:11 AM  SpO2 100 % 12/18/2018  4:11 AM  Vitals shown include unvalidated device data.  Last Pain:  Vitals:   12/18/18 0147  TempSrc: Axillary  PainSc:          Complications: No apparent anesthesia complications

## 2018-12-18 NOTE — Anesthesia Post-op Follow-up Note (Signed)
Anesthesia QCDR form completed.        

## 2018-12-19 LAB — CBC
HCT: 24.6 % — ABNORMAL LOW (ref 36.0–46.0)
Hemoglobin: 8.9 g/dL — ABNORMAL LOW (ref 12.0–15.0)
MCH: 29.3 pg (ref 26.0–34.0)
MCHC: 36.2 g/dL — ABNORMAL HIGH (ref 30.0–36.0)
MCV: 80.9 fL (ref 80.0–100.0)
Platelets: 207 10*3/uL (ref 150–400)
RBC: 3.04 MIL/uL — ABNORMAL LOW (ref 3.87–5.11)
RDW: 14.9 % (ref 11.5–15.5)
WBC: 16 10*3/uL — ABNORMAL HIGH (ref 4.0–10.5)
nRBC: 0 % (ref 0.0–0.2)

## 2018-12-19 NOTE — Lactation Note (Signed)
This note was copied from a baby's chart. Lactation Consultation Note  Patient Name: Maria Gentry BHALP'F Date: 12/19/2018 Reason for consult: Initial assessment   Maternal Data    Feeding    LATCH Score                   Interventions    Lactation Tools Discussed/Used Pump Review: Setup, frequency, and cleaning Initiated by:: Arlyss Gandy RN IBCLC Date initiated:: 12/19/18   Consult Status Consult Status: PRN Date: 12/19/18 Follow-up type: In-patient  LC assisted mother with set up of the DEBP and initiated pumping. Mother states that her nipples are extremely sore and bruised from breastfeeding attempts. Mother desires to pump right now until nipples are healed.  Arlyss Gandy 12/19/2018, 2:34 PM

## 2018-12-19 NOTE — Progress Notes (Signed)
  Subjective:   Post Op Day 1: Patient reports doing well. Her pain is controlled with PO medication and On Q pump. She is tolerating PO intake. She is ambulating and voiding without difficulty. She reports nipple pain with breastfeeding. She is working with lactation on latch and pumping.   Objective:  Blood pressure 112/68, pulse 80, temperature 98.6 F (37 C), temperature source Oral, resp. rate 18, height 5' (1.524 m), weight 76.7 kg, last menstrual period 03/10/2018, SpO2 98 %, currently breastfeeding.  General: NAD Pulmonary: no increased work of breathing Abdomen: non-distended, non-tender, fundus firm at level of umbilicus Incision: honeycomb dressing is clean/dry/intact Extremities: +1 lower extremity edema, no erythema, no tenderness  No results found for this or any previous visit (from the past 24 hour(s)).  Intake/Output Summary (Last 24 hours) at 12/19/2018 1213 Last data filed at 12/19/2018 0000 Gross per 24 hour  Intake 1604.78 ml  Output 700 ml  Net 904.78 ml      Assessment:   23 y.o. G1P1001 postoperativeday # 1   Plan:  1) Acute blood loss anemia - hemodynamically stable and asymptomatic - po ferrous sulfate  2) B positive, Rubella Immune, Varicella Non-immune- will offer varivax before discharge  3) TDAP status: given antepartum   4) Breast/Formula/Contraception: not discussed  5) Disposition: continue current care, discharge to home tomorrow   Parke Poisson, CNM [facility] 12/19/2018, 12:16 PM

## 2018-12-20 ENCOUNTER — Encounter: Payer: Self-pay | Admitting: Obstetrics and Gynecology

## 2018-12-20 MED ORDER — NORETHINDRONE 0.35 MG PO TABS: 1.0000 | ORAL_TABLET | Freq: Every day | ORAL | 11 refills | Status: DC

## 2018-12-20 MED ORDER — OXYCODONE HCL 5 MG PO TABS: 5.0000 mg | ORAL_TABLET | Freq: Four times a day (QID) | ORAL | 0 refills | Status: AC | PRN

## 2018-12-20 NOTE — Discharge Instructions (Signed)
Discharge Instructions:   Follow-up Appointment: June 9th at 11am at [facility] with Dr. Jean Rosenthal  If there are any new medications, they have been ordered and will be available for pickup at the listed pharmacy on your way home from the hospital.   Call office if you have any of the following: headache, visual changes, fever >101.0 F, chills, shortness of breath, breast concerns, excessive vaginal bleeding, incision drainage or problems, leg pain or redness, depression or any other concerns. If you have vaginal discharge with an odor, let your doctor know.   It is normal to bleed for up to 6 weeks. You should not soak through more than 1 pad in 1 hour. If you have a blood clot larger than your fist with continued bleeding, call your doctor.   After a c-section, you should expect a small amount of blood or clear fluid coming from the incision and abdominal cramping/soreness. Inspect your incision site daily. Stand in front of a mirror to look for any redness, incision opening, or discolored/odorness drainage. Take a shower daily and continue good hygiene. Use own towel and washcloth (do not share). Make sure your sheets on your bed are clean. No pets sleeping around your incision site. Dressing will be removed at your postpartum visit. If the dressing does become wet or soiled underneath, it is okay to remove it.   On-Q pump: You will remove on day 5 after insertion or if the ball becomes flat before day 5.   Activity: Do not lift > 10 lbs for 6 weeks (do not lift anything heavier than your baby). No intercourse, tampons, swimming pools, hot tubs, baths (only showers) for 6 weeks.  No driving for 1-2 weeks. Continue prenatal vitamin, especially if breastfeeding. Increase calories and fluids (water) while breastfeeding.   Your milk will come in, in the next couple of days (right now it is colostrum). You may have a slight fever when your milk comes in, but it should go away on its own.  If it  does not, and rises above 101 F please call the doctor. You will also feel achy and your breasts will be firm. They will also start to leak. If you are breastfeeding, continue as you have been and you can pump/express milk for comfort.   If you have too much milk, your breasts can become engorged, which could lead to mastitis. This is an infection of the milk ducts. It can be very painful and you will need to notify your doctor to obtain a prescription for antibiotics. You can also treat it with a shower or hot/cold compress.   For concerns about your baby, please call your pediatrician.  For breastfeeding concerns, the lactation consultant can be reached at [phone].   Postpartum blues (feelings of happy one minute and sad another minute) are normal for the first few weeks but if it gets worse let your doctor know.   Congratulations! We enjoyed caring for you and your new bundle of joy!

## 2018-12-20 NOTE — Anesthesia Procedure Notes (Signed)
Epidural Patient location during procedure: OB Start time: 12/18/2018 2:20 AM End time: 12/18/2018 2:46 AM  Staffing Anesthesiologist: Jovita Gamma, MD Performed: anesthesiologist   Preanesthetic Checklist Completed: patient identified, site marked, surgical consent, pre-op evaluation, timeout performed, IV checked, risks and benefits discussed and monitors and equipment checked  Epidural Patient position: sitting Prep: ChloraPrep Patient monitoring: heart rate, continuous pulse ox and blood pressure Approach: midline Location: L3-L4 Injection technique: LOR saline  Needle:  Needle type: Tuohy  Needle gauge: 18 G Needle length: 9 cm and 9 Needle insertion depth: 5 cm Catheter type: closed end flexible Catheter size: 20 Guage Catheter at skin depth: 10 cm Test dose: negative  Assessment Events: blood not aspirated, injection not painful, no injection resistance, negative IV test and no paresthesia  Additional Notes  CSE. 1 mL 0.75% bupivacaine Patient tolerated the insertion well without complications.Reason for block:surgical anesthesia

## 2018-12-20 NOTE — Progress Notes (Signed)
Patient discharged home with infant. FOB present at discharge. Discharge instructions and prescriptions given and reviewed with patient. Patient verbalized understanding. On-Q pump information and removal went over in detail. Escorted out by staff.

## 2018-12-26 ENCOUNTER — Ambulatory Visit (INDEPENDENT_AMBULATORY_CARE_PROVIDER_SITE_OTHER): Payer: Medicaid Other | Admitting: Obstetrics and Gynecology

## 2018-12-26 ENCOUNTER — Encounter: Payer: Self-pay | Admitting: Obstetrics and Gynecology

## 2018-12-26 ENCOUNTER — Other Ambulatory Visit: Payer: Self-pay

## 2018-12-26 VITALS — BP 114/70 | Wt 143.0 lb

## 2018-12-26 DIAGNOSIS — Z09 Encounter for follow-up examination after completed treatment for conditions other than malignant neoplasm: Secondary | ICD-10-CM

## 2018-12-26 DIAGNOSIS — Z98891 History of uterine scar from previous surgery: Secondary | ICD-10-CM

## 2018-12-26 IMAGING — RF DG HYSTEROGRAM
1 series · 5 of 5 positions shown · non-contrast
Comparison: None.

CLINICAL DATA: Infertility

EXAM:
HYSTEROSALPINGOGRAM
TECHNIQUE: Hysterosalpingogram was performed by the ordering physician under
fluoroscopy. Fluoroscopic images were submitted for radiologic
interpretation following the procedure. Please see the procedural
report for the amount of contrast and the fluoroscopy time utilized.

[Series 1: fluoro_hsg_singleshot_bw · 0.17mm/px · 5 of 5 slices shown]
[im 1/5]
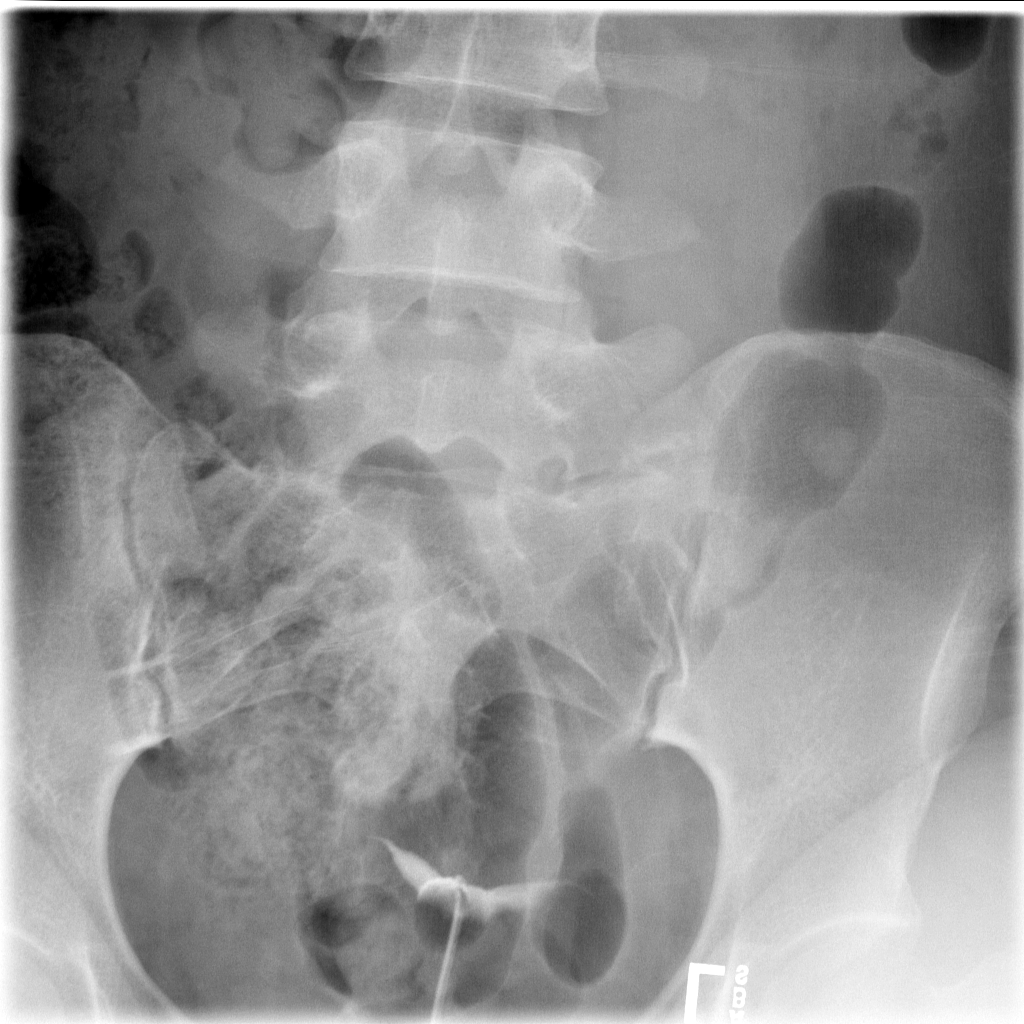
[im 2/5]
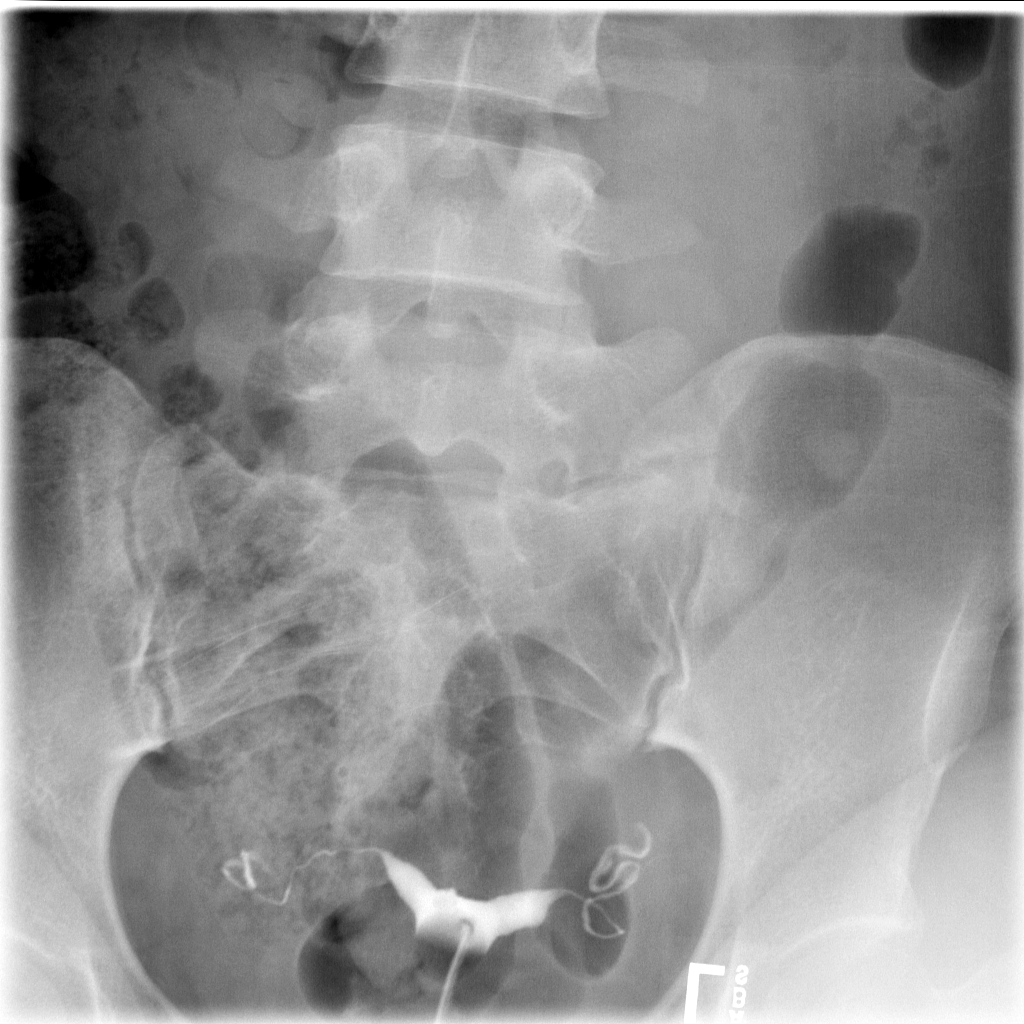
[im 3/5]
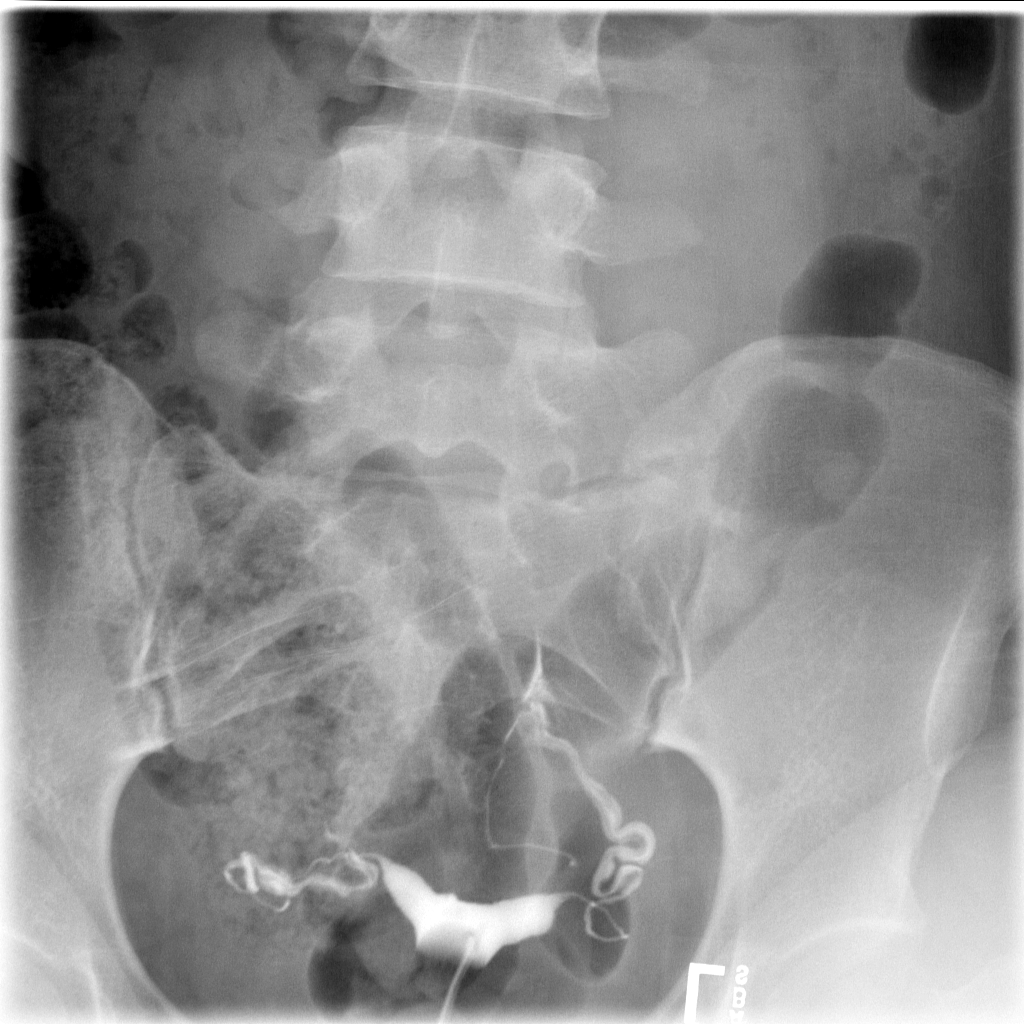
[im 4/5]
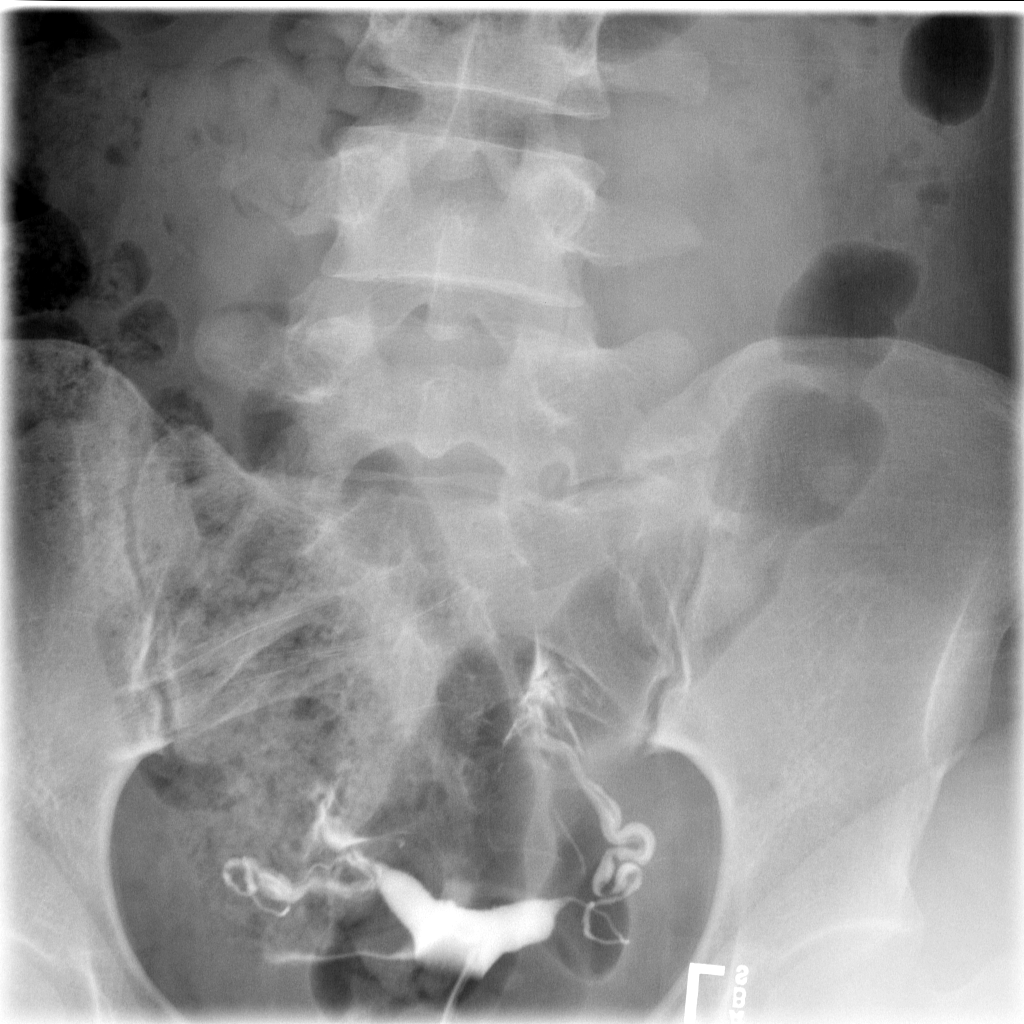
[im 5/5]
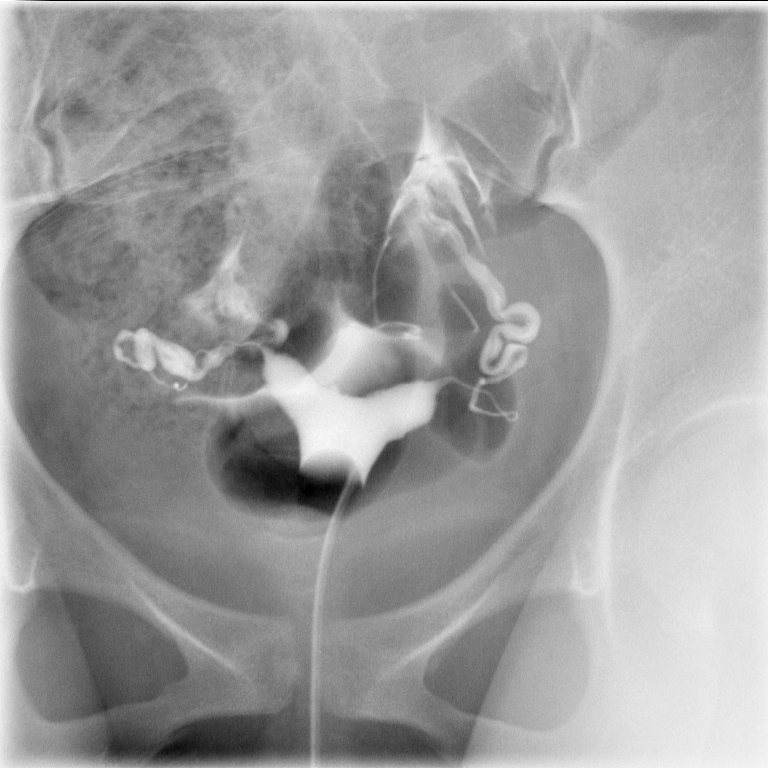

[5 of 5 positions shown; findings below may reference images not displayed]

FINDINGS: Fluoroscopic time: 2 minutes 12 seconds

There is free flow contrast material into the uterine cavity. The
uterus demonstrates an arcuate configuration. No filling defects are
noted. The fallopian tubes fill slowly with bilateral fallopian tube
spill into the peritoneum. The spill occurs on the left prior to the
right.
IMPRESSION: Patent fallopian tubes bilaterally.

Arcuate uterus.

## 2018-12-26 NOTE — Progress Notes (Signed)
   Postoperative Follow-up Patient presents post op from cesarean section  1 week ago.  Subjective: She denies fever, chills, nausea and vomiting. Eating a regular diet without difficulty. The patient is not having any pain.  Activity: increasing slowly. She denies issues with her incision.    Objective: BP 114/70   Wt 143 lb (64.9 kg)   BMI 27.93 kg/m   Constitutional: Well nourished, well developed female in no acute distress.  HEENT: normal Skin: Warm and dry.  Abdomen: s/nt/nd/+BS clean, dry, intact and without erythema, induration, warmth, and tenderness Extremity: no edema   Assessment: 23 y.o. s/p cesarean section progressing well  Plan: Patient has done well after surgery with no apparent complications.  I have discussed the post-operative course to date, and the expected progress moving forward.  The patient understands what complications to be concerned about.    Activity plan: increase slowly. No lifting > 20 lb x 5 more weeks. Wound care instructions reviewed.  Honeycomb bandage removed today.   Return in about 5 weeks (around 01/30/2019) for Six Week Postpartum.  Prentice Docker, MD 12/26/2018 11:41 AM

## 2019-01-30 ENCOUNTER — Other Ambulatory Visit: Payer: Self-pay

## 2019-01-30 ENCOUNTER — Ambulatory Visit (INDEPENDENT_AMBULATORY_CARE_PROVIDER_SITE_OTHER): Payer: Medicaid Other | Admitting: Obstetrics and Gynecology

## 2019-01-30 ENCOUNTER — Encounter: Payer: Self-pay | Admitting: Obstetrics and Gynecology

## 2019-01-30 NOTE — Progress Notes (Signed)
Postpartum Visit  Chief Complaint:  Chief Complaint  Patient presents with  . Postpartum Care    History of Present Illness: Patient is a 23 y.o. G1P1001 presents for postpartum visit.  Date of delivery: 12/18/2018 Type of delivery: C-Section  Pregnancy or labor problems:  No  Any problems since the delivery:  no  Newborn Details:  SINGLETON :  1. Baby's name: Bruna PotterSilas. Birth weight: 8.11lb Maternal Details:  Breast Feeding:  yes Post partum depression/anxiety noted: no Edinburgh Post-Partum Depression Score:  5  Date of last PAP: 11/05/2016/NORMAL   Past Medical History:  Diagnosis Date  . Amenorrhea   . Cyst of ovary   . History of imperforate hymen   . History of vaccination against human papillomavirus   . Irregular menses   . Medical history non-contributory     Past Surgical History:  Procedure Laterality Date  . CESAREAN SECTION N/A 12/18/2018   Procedure: CESAREAN SECTION;  Surgeon: Conard NovakJackson, Stephen D, MD;  Location: [facility] ORS;  Service: Obstetrics;  Laterality: N/A;  . EYE SURGERY    . HYMENECTOMY  12/2015    Prior to Admission medications   Medication Sig Start Date End Date Taking? Authorizing Provider  ferrous sulfate 325 (65 FE) MG EC tablet Take 325 mg by mouth every morning.    [provider]  norethindrone (MICRONOR) 0.35 MG tablet Take 1 tablet (0.35 mg total) by mouth daily. 12/31/18   Tresea MallGledhill, Jane, CNM  prenatal vitamin w/FE, FA (PRENATAL 1 + 1) 27-1 MG TABS tablet Take 1 tablet by mouth daily at 12 noon.    [provider]    No Known Allergies   Social History   Socioeconomic History  . Marital status: Married    Spouse name: Not on file  . Number of children: Not on file  . Years of education: Not on file  . Highest education level: Not on file  Occupational History  . Not on file  Social Needs  . Financial resource strain: Not hard at all  . Food insecurity    Worry: Never true    Inability: Never true  .  Transportation needs    Medical: No    Non-medical: No  Tobacco Use  . Smoking status: Never Smoker  . Smokeless tobacco: Never Used  Substance and Sexual Activity  . Alcohol use: No  . Drug use: No  . Sexual activity: Yes    Comment: undecided  Lifestyle  . Physical activity    Days per week: 4 days    Minutes per session: 30 min  . Stress: Not at all  Relationships  . Social connections    Talks on phone: More than three times a week    Gets together: More than three times a week    Attends religious service: More than 4 times per year    Active member of club or organization: Yes    Attends meetings of clubs or organizations: 1 to 4 times per year    Relationship status: Married  . Intimate partner violence    Fear of current or ex partner: No    Emotionally abused: No    Physically abused: No    Forced sexual activity: No  Other Topics Concern  . Not on file  Social History Narrative  . Not on file    Family History  Problem Relation Age of Onset  . Skin cancer Mother   . Ovarian cancer Other     Review of Systems  Constitutional: Negative.   HENT: Negative.   Eyes: Negative.   Respiratory: Negative.   Cardiovascular: Negative.   Gastrointestinal: Negative.   Genitourinary: Negative.   Musculoskeletal: Negative.   Skin: Negative.   Neurological: Negative.   Psychiatric/Behavioral: Negative.      Physical Exam BP 124/78   Ht 5' (1.524 m)   Wt 134 lb (60.8 kg)   Breastfeeding Yes   BMI 26.17 kg/m   Physical Exam Constitutional:      General: She is not in acute distress.    Appearance: Normal appearance. She is well-developed.  Genitourinary:     Pelvic exam was performed with patient in the lithotomy position.     Vulva, inguinal canal, urethra, bladder, vagina, uterus, right adnexa and left adnexa normal.     No posterior fourchette tenderness, injury or lesion present.     No cervical friability, lesion, bleeding or polyp.  HENT:     Head:  Normocephalic and atraumatic.  Eyes:     General: No scleral icterus.    Conjunctiva/sclera: Conjunctivae normal.  Neck:     Musculoskeletal: Normal range of motion and neck supple.  Cardiovascular:     Rate and Rhythm: Normal rate and regular rhythm.     Heart sounds: No murmur. No friction rub. No gallop.   Pulmonary:     Effort: Pulmonary effort is normal. No respiratory distress.     Breath sounds: Normal breath sounds. No wheezing or rales.  Abdominal:     General: Bowel sounds are normal. There is no distension.     Palpations: Abdomen is soft. There is no mass.     Tenderness: There is no abdominal tenderness. There is no guarding or rebound.     Comments: without erythema, induration, warmth, and tenderness. It is clean, dry, and intact.    Musculoskeletal: Normal range of motion.  Neurological:     General: No focal deficit present.     Mental Status: She is alert and oriented to person, place, and time.     Cranial Nerves: No cranial nerve deficit.  Skin:    General: Skin is warm and dry.     Findings: No erythema.  Psychiatric:        Mood and Affect: Mood normal.        Behavior: Behavior normal.        Judgment: Judgment normal.      Female Chaperone present during breast and/or pelvic exam.  Assessment: 23 y.o. G1P1001 presenting for 6 week postpartum visit  Plan: Problem List Items Addressed This Visit    None    Visit Diagnoses    Postpartum care and examination of lactating mother    -  Primary       1) Contraception Education given regarding options for contraception, including oral contraceptives.  2)  Pap - ASCCP guidelines and rational discussed.  Patient opts for routine screening interval  3) Patient underwent screening for postpartum depression with no concerns noted.  4) Follow up 1 year for routine annual exam  Prentice Docker, MD 01/30/2019 10:58 AM

## 2019-11-05 ENCOUNTER — Other Ambulatory Visit: Payer: Self-pay | Admitting: Advanced Practice Midwife

## 2019-11-05 NOTE — Telephone Encounter (Signed)
LVM to see if pt is still breastfeeding

## 2019-11-05 NOTE — Telephone Encounter (Signed)
Pt states she is no longer breastfeeding and would like the Junel OCP. She was on this before she was pregnant. Annual isn't due until July

## 2019-11-06 ENCOUNTER — Other Ambulatory Visit: Payer: Self-pay | Admitting: Advanced Practice Midwife

## 2019-11-06 DIAGNOSIS — Z3041 Encounter for surveillance of contraceptive pills: Secondary | ICD-10-CM

## 2019-11-06 MED ORDER — NORETHIN ACE-ETH ESTRAD-FE 1-20 MG-MCG PO TABS: 1.0000 | ORAL_TABLET | Freq: Every day | ORAL | 1 refills | Status: DC

## 2019-11-06 NOTE — Progress Notes (Unsigned)
Birth control switched to [facility] per patient request. Rx sent for 6 month supply. She will need annual exam prior to further refills

## 2020-03-14 ENCOUNTER — Other Ambulatory Visit (HOSPITAL_COMMUNITY)
Admission: RE | Admit: 2020-03-14 | Discharge: 2020-03-14 | Disposition: A | Discharge status: Home / Self Care | Payer: Medicaid Other | Source: Ambulatory Visit | Attending: Obstetrics and Gynecology | Admitting: Obstetrics and Gynecology

## 2020-03-14 ENCOUNTER — Encounter: Payer: Self-pay | Admitting: Obstetrics and Gynecology

## 2020-03-14 ENCOUNTER — Other Ambulatory Visit: Payer: Self-pay

## 2020-03-14 ENCOUNTER — Ambulatory Visit (INDEPENDENT_AMBULATORY_CARE_PROVIDER_SITE_OTHER): Payer: Medicaid Other | Admitting: Obstetrics and Gynecology

## 2020-03-14 VITALS — BP 90/60 | Ht 60.0 in | Wt 117.0 lb

## 2020-03-14 DIAGNOSIS — Z124 Encounter for screening for malignant neoplasm of cervix: Secondary | ICD-10-CM | POA: Diagnosis not present

## 2020-03-14 DIAGNOSIS — Z113 Encounter for screening for infections with a predominantly sexual mode of transmission: Secondary | ICD-10-CM

## 2020-03-14 DIAGNOSIS — Z1339 Encounter for screening examination for other mental health and behavioral disorders: Secondary | ICD-10-CM

## 2020-03-14 DIAGNOSIS — Z1331 Encounter for screening for depression: Secondary | ICD-10-CM | POA: Diagnosis not present

## 2020-03-14 DIAGNOSIS — N914 Secondary oligomenorrhea: Secondary | ICD-10-CM

## 2020-03-14 DIAGNOSIS — R6889 Other general symptoms and signs: Secondary | ICD-10-CM

## 2020-03-14 DIAGNOSIS — Z01419 Encounter for gynecological examination (general) (routine) without abnormal findings: Secondary | ICD-10-CM | POA: Insufficient documentation

## 2020-03-14 MED ORDER — MEDROXYPROGESTERONE ACETATE 10 MG PO TABS: 10.0000 mg | ORAL_TABLET | Freq: Every day | ORAL | 6 refills | Status: DC

## 2020-03-14 NOTE — Progress Notes (Signed)
Gynecology Annual Exam  PCP: Kandyce Rud, MD  Chief Complaint  Patient presents with  . Gynecologic Exam    no concerns   History of Present Illness:  [name] is a 24 y.o. G1P1001 who LMP was Patient's last menstrual period was 01/13/2020 (approximate)., presents today for her annual examination.  Her menses are irregular and she hasn't had a period since stopping.  She doesn't have a history of PCOS.  She does have anovulatory cycles.    She is sexually active. She uses nothing for contraception.   Last Pap: 10/2016  Results were: no abnormalities /neg HPV DNA not done due to age Hx of STDs: none  There is no FH of breast cancer. There is no FH of ovarian cancer. The patient does not do self-breast exams.  Tobacco use: The patient denies current or previous tobacco use. Alcohol use: none Exercise: not so much  The patient wears seatbelts: yes.   The patient reports that domestic violence in her life is absent.   She notes cold intolerance.  Her skin has been dry and itchy.  Her appetite has been off, too.  She has no constipation.   Past Medical History:  Diagnosis Date  . Amenorrhea   . Cyst of ovary   . History of imperforate hymen   . History of vaccination against human papillomavirus   . Irregular menses   . Medical history non-contributory     Past Surgical History:  Procedure Laterality Date  . CESAREAN SECTION N/A 12/18/2018   Procedure: CESAREAN SECTION;  Surgeon: Conard Novak, MD;  Location: [facility] ORS;  Service: Obstetrics;  Laterality: N/A;  . EYE SURGERY    . HYMENECTOMY  12/2015    Prior to Admission medications: Denies   Allergies: No Known Allergies  Obstetric History: G1P1001, s/p c-section  Social History   Socioeconomic History  . Marital status: Married    Spouse name: Not on file  . Number of children: Not on file  . Years of education: Not on file  . Highest education level: Not on file  Occupational History  . Not on  file  Tobacco Use  . Smoking status: Never Smoker  . Smokeless tobacco: Never Used  Vaping Use  . Vaping Use: Never used  Substance and Sexual Activity  . Alcohol use: No  . Drug use: No  . Sexual activity: Yes    Birth control/protection: None  Other Topics Concern  . Not on file  Social History Narrative  . Not on file   Social Determinants of Health   Financial Resource Strain:   . Difficulty of Paying Living Expenses: Not on file  Food Insecurity:   . Worried About Programme researcher, broadcasting/film/video in the Last Year: Not on file  . Ran Out of Food in the Last Year: Not on file  Transportation Needs:   . Lack of Transportation (Medical): Not on file  . Lack of Transportation (Non-Medical): Not on file  Physical Activity:   . Days of Exercise per Week: Not on file  . Minutes of Exercise per Session: Not on file  Stress:   . Feeling of Stress : Not on file  Social Connections:   . Frequency of Communication with Friends and Family: Not on file  . Frequency of Social Gatherings with Friends and Family: Not on file  . Attends Religious Services: Not on file  . Active Member of Clubs or Organizations: Not on file  . Attends Club  or Organization Meetings: Not on file  . Marital Status: Not on file  Intimate Partner Violence:   . Fear of Current or Ex-Partner: Not on file  . Emotionally Abused: Not on file  . Physically Abused: Not on file  . Sexually Abused: Not on file    Family History  Problem Relation Age of Onset  . Skin cancer Mother   . Ovarian cancer Other     Review of Systems  Constitutional: Negative.   HENT: Negative.   Eyes: Negative.   Respiratory: Negative.   Cardiovascular: Negative.   Gastrointestinal: Negative.   Genitourinary: Negative.   Musculoskeletal: Negative.   Skin: Negative.   Neurological: Negative.   Psychiatric/Behavioral: Negative.      Physical Exam BP 90/60   Ht 5' (1.524 m)   Wt 117 lb (53.1 kg)   LMP 01/13/2020 (Approximate)    Breastfeeding No   BMI 22.85 kg/m    Physical Exam Constitutional:      General: She is not in acute distress.    Appearance: Normal appearance. She is well-developed.  Genitourinary:     Pelvic exam was performed with patient in the lithotomy position.     Vulva, urethra, bladder and uterus normal.     No inguinal adenopathy present in the right or left side.    No signs of injury in the vagina.     No vaginal discharge, erythema, tenderness or bleeding.     No cervical motion tenderness, discharge, lesion or polyp.     Uterus is mobile.     Uterus is not enlarged or tender.     No uterine mass detected.    Uterus is anteverted.     No right or left adnexal mass present.     Right adnexa not tender or full.     Left adnexa not tender or full.  HENT:     Head: Normocephalic and atraumatic.  Eyes:     General: No scleral icterus.    Conjunctiva/sclera: Conjunctivae normal.  Neck:     Thyroid: No thyromegaly.  Cardiovascular:     Rate and Rhythm: Normal rate and regular rhythm.     Heart sounds: No murmur heard.  No friction rub. No gallop.   Pulmonary:     Effort: Pulmonary effort is normal. No respiratory distress.     Breath sounds: Normal breath sounds. No wheezing or rales.  Chest:     Breasts:        Right: No inverted nipple, mass, nipple discharge, skin change or tenderness.        Left: No inverted nipple, mass, nipple discharge, skin change or tenderness.  Abdominal:     General: Bowel sounds are normal. There is no distension.     Palpations: Abdomen is soft. There is no mass.     Tenderness: There is no abdominal tenderness. There is no guarding or rebound.  Musculoskeletal:        General: No swelling or tenderness. Normal range of motion.     Cervical back: Normal range of motion and neck supple.  Lymphadenopathy:     Cervical: No cervical adenopathy.     Lower Body: No right inguinal adenopathy. No left inguinal adenopathy.  Neurological:     General:  No focal deficit present.     Mental Status: She is alert and oriented to person, place, and time.     Cranial Nerves: No cranial nerve deficit.  Skin:    General: Skin is warm  and dry.     Findings: No erythema or rash.  Psychiatric:        Mood and Affect: Mood normal.        Behavior: Behavior normal.        Judgment: Judgment normal.     Female chaperone present for pelvic and breast  portions of the physical exam  Results: AUDIT Questionnaire (screen for alcoholism): 0 PHQ-9: 4   Assessment: 23 y.o. G21P1001 female here for routine annual gynecologic examination  Plan: Problem List Items Addressed This Visit    None    Visit Diagnoses    Women's annual routine gynecological examination    -  Primary   Relevant Orders   TSH + free T4   Cytology - PAP   Screening for depression       Screening for alcoholism       Pap smear for cervical cancer screening       Relevant Orders   Cytology - PAP   Screen for STD (sexually transmitted disease)       Heat intolerance       Relevant Orders   TSH + free T4     Screening: -- Blood pressure screen normal -- Weight screening: normal -- Depression screening negative (PHQ-9) -- Nutrition: normal -- cholesterol screening: not due for screening -- osteoporosis screening: not due -- tobacco screening: not using -- alcohol screening: AUDIT questionnaire indicates low-risk usage. -- family history of breast cancer screening: done. not at high risk. -- no evidence of domestic violence or intimate partner violence. -- STD screening: gonorrhea/chlamydia NAAT collected -- pap smear collected per ASCCP guidelines -- has not received COVID19 vaccine.   Thomasene Mohair, MD 03/14/2020 10:23 AM

## 2020-03-15 LAB — TSH+FREE T4
Free T4: 1.24 ng/dL (ref 0.82–1.77)
TSH: 0.788 u[IU]/mL (ref 0.450–4.500)

## 2020-03-18 LAB — CYTOLOGY - PAP
Chlamydia: NEGATIVE
Comment: NEGATIVE
Comment: NORMAL
Diagnosis: NEGATIVE
Neisseria Gonorrhea: NEGATIVE

## 2020-05-02 ENCOUNTER — Other Ambulatory Visit: Payer: Self-pay

## 2020-10-21 ENCOUNTER — Other Ambulatory Visit: Payer: Self-pay | Admitting: Obstetrics and Gynecology

## 2020-10-21 DIAGNOSIS — N926 Irregular menstruation, unspecified: Secondary | ICD-10-CM

## 2020-10-22 ENCOUNTER — Other Ambulatory Visit: Payer: Medicaid Other

## 2020-10-22 ENCOUNTER — Other Ambulatory Visit: Payer: Self-pay

## 2020-10-22 DIAGNOSIS — N926 Irregular menstruation, unspecified: Secondary | ICD-10-CM

## 2020-10-23 LAB — BETA HCG QUANT (REF LAB): hCG Quant: <1 m[IU]/mL

## 2021-03-20 ENCOUNTER — Ambulatory Visit: Payer: Medicaid Other | Admitting: Obstetrics and Gynecology

## 2021-05-15 ENCOUNTER — Telehealth: Payer: Self-pay | Admitting: Obstetrics and Gynecology

## 2021-05-15 NOTE — Telephone Encounter (Signed)
Please let me know when pt calls back. LM with her per SDJ last note

## 2021-05-15 NOTE — Telephone Encounter (Signed)
This pt called in wanting to know if you could prescribe medication for her because she is ready to have another baby.  She is not able to come for an appt bc her insurance doesn't take effect until January 2023.

## 2021-05-22 ENCOUNTER — Ambulatory Visit: Payer: Medicaid Other | Admitting: Obstetrics and Gynecology

## 2021-05-25 ENCOUNTER — Telehealth: Payer: Self-pay

## 2021-05-25 NOTE — Telephone Encounter (Signed)
Pt calling; was told when ready to try to conceive again to let us know; pt would like rx for ovulation.  [phone]

## 2021-05-28 NOTE — Telephone Encounter (Signed)
Patient is calling to follow up on message left. Patient is aware your in Clinic today and seeing patients. Please advise

## 2021-06-01 NOTE — Telephone Encounter (Signed)
Pt aware; she does not have periods; needs med sent in to force a period.

## 2021-06-02 ENCOUNTER — Other Ambulatory Visit: Payer: Self-pay | Admitting: Obstetrics and Gynecology

## 2021-06-02 DIAGNOSIS — N914 Secondary oligomenorrhea: Secondary | ICD-10-CM

## 2021-06-02 MED ORDER — MEDROXYPROGESTERONE ACETATE 10 MG PO TABS: 10.0000 mg | ORAL_TABLET | Freq: Every day | ORAL | 0 refills | Status: DC

## 2021-06-02 NOTE — Telephone Encounter (Signed)
Pt aware; wanted to know what to do after that.  Adv to let him know when she starts.

## 2021-07-29 DIAGNOSIS — O0993 Supervision of high risk pregnancy, unspecified, third trimester: Secondary | ICD-10-CM | POA: Insufficient documentation

## 2021-07-29 DIAGNOSIS — O30031 Twin pregnancy, monochorionic/diamniotic, first trimester: Secondary | ICD-10-CM | POA: Insufficient documentation

## 2021-07-29 DIAGNOSIS — Z98891 History of uterine scar from previous surgery: Secondary | ICD-10-CM | POA: Insufficient documentation

## 2021-08-12 DIAGNOSIS — F419 Anxiety disorder, unspecified: Secondary | ICD-10-CM | POA: Insufficient documentation

## 2021-08-13 DIAGNOSIS — Z2839 Other underimmunization status: Secondary | ICD-10-CM | POA: Insufficient documentation

## 2021-11-25 ENCOUNTER — Other Ambulatory Visit: Payer: Self-pay | Admitting: Obstetrics and Gynecology

## 2021-11-25 DIAGNOSIS — D509 Iron deficiency anemia, unspecified: Secondary | ICD-10-CM | POA: Insufficient documentation

## 2021-11-25 NOTE — Progress Notes (Signed)
Factors complicating this pregnancy  ? ?1. H/o mental health diagnoses ? Dx: Anxiety and Depression ? Medications prior to pregnancy: never ? Medical team following her: n/a ? Medications during pregnancy: none ? Counseling: none ? 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 FOB testing - negative for trait ?  ?3. Previous C/S x1 ? C/s done for: failed induction ? Delivered by Dr. Jean Rosenthal - records in Care Everywhere ? Delivery preference: repeat c/s (may desire a VBAC if she goes into labor on her own) ? ? 4. Varicella NON-Immune ? ?5. Multi-fetal gestation - Mono/Di twins ? [x]  Referral to MFM sent 06/23/21 ?Recommendations: ?U/s scheduled with MFM every 2 weeks ?Fetal echo ordered by MFM ? Genetic Screening - completed with MFM ? [x]  ASA started at 12 weeks ? Twice weekly NST with AFI beginning at 32 weeks ? ?6. Anemia in pregnancy ?? 9.3 Hgb  ?? Added on ferritin 8 (L) ?? Started on twice daily iron supplement ?Iron infusions due to twin gestation.  ?

## 2021-12-04 DIAGNOSIS — O365931 Maternal care for other known or suspected poor fetal growth, third trimester, fetus 1: Secondary | ICD-10-CM | POA: Insufficient documentation

## 2021-12-04 DIAGNOSIS — O99013 Anemia complicating pregnancy, third trimester: Secondary | ICD-10-CM | POA: Insufficient documentation

## 2021-12-08 ENCOUNTER — Ambulatory Visit
Admission: RE | Admit: 2021-12-08 | Discharge: 2021-12-08 | Disposition: A | Discharge status: Home / Self Care | Payer: Medicaid Other | Source: Ambulatory Visit | Attending: Obstetrics and Gynecology | Admitting: Obstetrics and Gynecology

## 2021-12-08 DIAGNOSIS — D509 Iron deficiency anemia, unspecified: Secondary | ICD-10-CM | POA: Diagnosis not present

## 2021-12-08 DIAGNOSIS — O99019 Anemia complicating pregnancy, unspecified trimester: Secondary | ICD-10-CM | POA: Insufficient documentation

## 2021-12-08 MED ORDER — SODIUM CHLORIDE 0.9 % IV SOLN
200.0000 mg | INTRAVENOUS | Status: DC
  Administered 2021-12-08: 200 mg via INTRAVENOUS
  Filled 2021-12-08: qty 200

## 2021-12-15 ENCOUNTER — Other Ambulatory Visit: Payer: Self-pay

## 2021-12-15 ENCOUNTER — Encounter
Admission: RE | Admit: 2021-12-15 | Discharge: 2021-12-15 | Disposition: A | Discharge status: Home / Self Care | Payer: Medicaid Other | Source: Ambulatory Visit | Attending: Obstetrics and Gynecology | Admitting: Obstetrics and Gynecology

## 2021-12-15 NOTE — Patient Instructions (Addendum)
Your procedure is scheduled on: 12/22/2021  Please arrive at ED to check in at 5:15 am and tell them you re having a C-section surgery and they will accompany you to 3rd floor.   REMEMBER: Instructions that are not followed completely may result in serious medical risk, up to and including death; or upon the discretion of your surgeon and anesthesiologist your surgery may need to be rescheduled.  Do not eat food after midnight the night before surgery.  No gum chewing, lozengers or hard candies.   TAKE THESE MEDICATIONS THE MORNING OF SURGERY WITH A SIP OF WATER: Pepcid Colace Pre -natal    Follow recommendations from Cardiologist, Pulmonologist or PCP regarding stopping Aspirin.  One week prior to surgery: Stop Anti-inflammatories (NSAIDS) such as Advil, Aleve, Ibuprofen, Motrin, Naproxen, Naprosyn and Aspirin based products such as Excedrin, Goodys Powder, BC Powder. Stop ANY OVER THE COUNTER supplements until after surgery. You may however, continue to take Tylenol if needed for pain up until the day of surgery.     On the morning of surgery brush your teeth with toothpaste and water, you may rinse your mouth with mouthwash if you wish. Do not swallow any toothpaste or mouthwash.  Use CHG Soap  with shower as directed.   Do not wear jewelry, make-up, hairpins, clips or nail polish.  Do not wear lotions, powders, or perfumes.   Do not shave body from the neck down 48 hours prior to surgery just in case you cut yourself which could leave a site for infection.  Also, freshly shaved skin may become irritated if using the CHG soap.  Contact lenses, hearing aids and dentures may not be worn into surgery.  Do not bring valuables to the hospital. [facility] is not responsible for any missing/lost belongings or valuables.    Notify your doctor if there is any change in your medical condition (cold, fever, infection).  Wear comfortable clothing (specific to your surgery type) to  the hospital.  After surgery, you can help prevent lung complications by doing breathing exercises.  Take deep breaths and cough every 1-2 hours. Your doctor may order a device called an Incentive Spirometer to help you take deep breaths. When coughing or sneezing, hold a pillow firmly against your incision with both hands. This is called "splinting." Doing this helps protect your incision. It also decreases belly discomfort.  If you are being admitted to the hospital overnight, leave your suitcase in the car. After surgery it may be brought to your room.  If you are being discharged the day of surgery, you will not be allowed to drive home. You will need a responsible adult (18 years or older) to drive you home and stay with you that night.   If you are taking public transportation, you will need to have a responsible adult (18 years or older) with you. Please confirm with your physician that it is acceptable to use public transportation.   Please call the [facility] at [phone] if you have any questions about these instructions.  Surgery Visitation Policy:  Patients undergoing a surgery or procedure may have two family members or support persons with them as long as the person is not COVID-19 positive or experiencing its symptoms.   Inpatient Visitation:    Visiting hours are 7 a.m. to 8 p.m. Up to four visitors are allowed at one time in a patient room, including children. The visitors may rotate out with other people during the day. One  designated support person (adult) may remain overnight.

## 2021-12-16 ENCOUNTER — Encounter
Admission: RE | Admit: 2021-12-16 | Discharge: 2021-12-16 | Disposition: A | Discharge status: Home / Self Care | Payer: Medicaid Other | Source: Ambulatory Visit | Attending: Obstetrics and Gynecology | Admitting: Obstetrics and Gynecology

## 2021-12-16 ENCOUNTER — Encounter: Payer: Self-pay | Admitting: Urgent Care

## 2021-12-16 ENCOUNTER — Ambulatory Visit
Admission: RE | Admit: 2021-12-16 | Discharge: 2021-12-16 | Disposition: A | Discharge status: Home / Self Care | Payer: Medicaid Other | Source: Ambulatory Visit | Attending: Obstetrics and Gynecology | Admitting: Obstetrics and Gynecology

## 2021-12-16 DIAGNOSIS — Z3A00 Weeks of gestation of pregnancy not specified: Secondary | ICD-10-CM | POA: Insufficient documentation

## 2021-12-16 DIAGNOSIS — Z3480 Encounter for supervision of other normal pregnancy, unspecified trimester: Secondary | ICD-10-CM

## 2021-12-16 DIAGNOSIS — O99013 Anemia complicating pregnancy, third trimester: Secondary | ICD-10-CM | POA: Diagnosis present

## 2021-12-16 DIAGNOSIS — D509 Iron deficiency anemia, unspecified: Secondary | ICD-10-CM | POA: Insufficient documentation

## 2021-12-16 LAB — CBC
HCT: 29.2 % — ABNORMAL LOW (ref 36.0–46.0)
Hemoglobin: 10.2 g/dL — ABNORMAL LOW (ref 12.0–15.0)
MCH: 26.9 pg (ref 26.0–34.0)
MCHC: 34.9 g/dL (ref 30.0–36.0)
MCV: 77 fL — ABNORMAL LOW (ref 80.0–100.0)
Platelets: 231 10*3/uL (ref 150–400)
RBC: 3.79 MIL/uL — ABNORMAL LOW (ref 3.87–5.11)
RDW: 16.6 % — ABNORMAL HIGH (ref 11.5–15.5)
WBC: 7.7 10*3/uL (ref 4.0–10.5)
nRBC: 0 % (ref 0.0–0.2)

## 2021-12-16 LAB — TYPE AND SCREEN
ABO/RH(D): B POS
Antibody Screen: NEGATIVE
Extend sample reason: UNDETERMINED

## 2021-12-16 LAB — RAPID HIV SCREEN (HIV 1/2 AB+AG)
HIV 1/2 Antibodies: NONREACTIVE
HIV-1 P24 Antigen - HIV24: NONREACTIVE

## 2021-12-16 MED ORDER — SODIUM CHLORIDE 0.9 % IV SOLN
200.0000 mg | Freq: Once | INTRAVENOUS | Status: AC
  Administered 2021-12-16: 200 mg via INTRAVENOUS
  Filled 2021-12-16: qty 10

## 2021-12-17 LAB — RPR: RPR Ser Ql: NONREACTIVE

## 2021-12-19 ENCOUNTER — Observation Stay
Admission: EM | Admit: 2021-12-19 | Discharge: 2021-12-19 | Disposition: A | Discharge status: Home / Self Care | Payer: Medicaid Other | Attending: Obstetrics and Gynecology | Admitting: Obstetrics and Gynecology

## 2021-12-19 DIAGNOSIS — Z298 Encounter for other specified prophylactic measures: Secondary | ICD-10-CM | POA: Diagnosis present

## 2021-12-19 DIAGNOSIS — O99013 Anemia complicating pregnancy, third trimester: Secondary | ICD-10-CM | POA: Diagnosis not present

## 2021-12-19 DIAGNOSIS — Z3A34 34 weeks gestation of pregnancy: Secondary | ICD-10-CM | POA: Insufficient documentation

## 2021-12-19 DIAGNOSIS — O30033 Twin pregnancy, monochorionic/diamniotic, third trimester: Secondary | ICD-10-CM | POA: Insufficient documentation

## 2021-12-19 DIAGNOSIS — D649 Anemia, unspecified: Secondary | ICD-10-CM | POA: Insufficient documentation

## 2021-12-19 MED ORDER — BETAMETHASONE SOD PHOS & ACET 6 (3-3) MG/ML IJ SUSP
INTRAMUSCULAR | Status: AC
  Filled 2021-12-19: qty 5

## 2021-12-19 MED ORDER — BETAMETHASONE SOD PHOS & ACET 6 (3-3) MG/ML IJ SUSP
12.0000 mg | INTRAMUSCULAR | Status: DC
  Administered 2021-12-19: 12 mg via INTRAMUSCULAR

## 2021-12-19 NOTE — Discharge Summary (Signed)
Maria Gentry presented to L&D for scheduled betamethasone injection.  She will return tomorrow for 2nd betamethosone injection and preOP labs.  NICU consult completed during visit today. Denies contractions, LOF, or vaginal bleeding.  Endorses good fetal movement.   Margaretmary Eddy, CNM Certified Nurse Midwife [facility]

## 2021-12-19 NOTE — Consult Note (Signed)
Consultation Service: Neonatology   Dr. Feliberto Gottron has asked for consultation on Tyrica C Hartline regarding the care of Mo/Di twin premature infants at [redacted]w[redacted]d. Thank you for inviting [place] to see this patient.   Reason for consult:  Explain the possible complications, the prognosis, and the care of a premature infant at 27 and 5/7 weeks.  Chief complaint: 26 y.o. female with twin Mo/Di female IUPs named Montserrat and Haddie. Pregnancy has been complicated by  IGUR of twin A, Varicella non immune, Anemia in pregnancy (10.2 hgb), Hgb C, and history of anxiety/depression .  Plan is for delivery via ceasarean section/vaginal delivery if possible.  My key findings of this patient's HPI are:  I have reviewed the patient's chart and have met with her. The salient information is as follows:   Mom was seen in observation due to need for betamethasone injection. No concerns at this time.   Prenatal labs:   Latest Reference Range & Units Most Recent  ABO Grouping  B 04/18/18 15:48  RH Type   Positive 04/18/18 15:48  Antibody Screen  NEG 12/16/21 09:23  Chlamydia  Negative 03/14/20 10:23  Neisseria Gonorrhea  Negative 03/14/20 10:23  GBSP Negative  Negative 11/21/18 12:23  Gonococcus by Nucleic Acid Amp Negative  Negative 11/05/16 10:33  RPR NON REACTIVE  NON REACTIVE 12/16/21 09:23  Rubella Immune >0.99 index 2.82 04/18/18 15:48  Varicella zoster IgG Immune >165 index <135 (L) 04/18/18 15:48     Prenatal care:   good Pregnancy complications:  IUGR of Twin A Maternal antibiotics: This patient's mother is not on file. Maternal Steroids: Betamethasone  Most recent dose:  6/3 - 6/4   My recommendations for this patient and my actions included:   1. In the presence of the Myrlene C Emmanuel, I spent 15 minutes discussing the possible complications and outcomes of prematurity at this gestational age. I discussed specific complications at this gestational age referencing the need for resuscitation at  birth due to respiratory distress which may require mechanical ventilation, CPAP, and surfactant administration. In addition infant may require IV fluids pending establishment of enteral feeds (encouraged breast milk feeding), antibiotics for possible sepsis, temperature support, and continuous monitoring. I also discussed the potential risk of complications in preterm infants at ~35 weeks such as hyperbilirubinemia, hypothermia, hypoglycemia, and poor PO feeding. I discussed this with mom  in detail and she expressed an understanding of the risks and complications of prematurity.   2. I also discussed the expected survival of an infant born at [redacted]w[redacted]d, which is (Very Good). We further discussed that (Very Few) of the neonates born at this age have profound or severe neurological complications and school difficulties. In addition, (Few) of the neonates born at this age will have some for of mild to moderate neurological complications. She expressed an understanding of this information.   3. I informed her that the NICU team would be present at the delivery. She agreed that all appropriate medical measures could be taken to resuscitate her infant at the delivery. She also understood that our team will always be available for any questions that come up during their infant's hospitalization and we will continue to partner with their family to support them through this difficult time. Visitation policy was discussed and all questions were addressed.   Final Impression:  26 y.o. female with twin (Mo/Di) female IUPs named "Kora" (twin A), and "Haddie" (twin B) who is threatening to deliver and who now understands the possible complications and  prognosis of her infant. The mother agrees with plan for resuscitation and ICU care. Pranavi C Mato's questions were answered. She is planning to try and provide breast milk for her infant.    ______________________________________________________________________  Thank  you for asking [place] to participate in the care of this patient. Please do not hesitate to contact us again if you are aware of any further ways we can be of assistance.   Sincerely,  Edman Circle, MD Attending Neonatologist   I spent ~35 minutes in consultation time, of which 15 minutes was spent in direct face to face counseling.

## 2021-12-20 ENCOUNTER — Observation Stay
Admission: EM | Admit: 2021-12-20 | Discharge: 2021-12-20 | Disposition: A | Discharge status: Home / Self Care | Payer: Medicaid Other

## 2021-12-20 DIAGNOSIS — Z3A34 34 weeks gestation of pregnancy: Secondary | ICD-10-CM | POA: Insufficient documentation

## 2021-12-20 DIAGNOSIS — O30033 Twin pregnancy, monochorionic/diamniotic, third trimester: Principal | ICD-10-CM | POA: Insufficient documentation

## 2021-12-20 DIAGNOSIS — Z79899 Other long term (current) drug therapy: Secondary | ICD-10-CM | POA: Diagnosis not present

## 2021-12-20 DIAGNOSIS — Z7982 Long term (current) use of aspirin: Secondary | ICD-10-CM | POA: Insufficient documentation

## 2021-12-20 DIAGNOSIS — O30039 Twin pregnancy, monochorionic/diamniotic, unspecified trimester: Secondary | ICD-10-CM | POA: Diagnosis present

## 2021-12-20 DIAGNOSIS — Z298 Encounter for other specified prophylactic measures: Secondary | ICD-10-CM | POA: Diagnosis present

## 2021-12-20 LAB — CBC
HCT: 30.3 % — ABNORMAL LOW (ref 36.0–46.0)
Hemoglobin: 10.6 g/dL — ABNORMAL LOW (ref 12.0–15.0)
MCH: 27.1 pg (ref 26.0–34.0)
MCHC: 35 g/dL (ref 30.0–36.0)
MCV: 77.5 fL — ABNORMAL LOW (ref 80.0–100.0)
Platelets: 212 10*3/uL (ref 150–400)
RBC: 3.91 MIL/uL (ref 3.87–5.11)
RDW: 16.7 % — ABNORMAL HIGH (ref 11.5–15.5)
WBC: 9.8 10*3/uL (ref 4.0–10.5)
nRBC: 0 % (ref 0.0–0.2)

## 2021-12-20 LAB — TYPE AND SCREEN
ABO/RH(D): B POS
Antibody Screen: NEGATIVE

## 2021-12-20 LAB — BASIC METABOLIC PANEL
Anion gap: 9 (ref 5–15)
BUN: 8 mg/dL (ref 6–20)
CO2: 20 mmol/L — ABNORMAL LOW (ref 22–32)
Calcium: 9.2 mg/dL (ref 8.9–10.3)
Chloride: 106 mmol/L (ref 98–111)
Creatinine, Ser: 0.5 mg/dL (ref 0.44–1.00)
GFR, Estimated: >60 mL/min (ref >60)
Glucose, Bld: 93 mg/dL (ref 70–99)
Potassium: 3.7 mmol/L (ref 3.5–5.1)
Sodium: 135 mmol/L (ref 135–145)

## 2021-12-20 MED ORDER — CALCIUM CARBONATE ANTACID 500 MG PO CHEW: 2.0000 | CHEWABLE_TABLET | ORAL | Status: DC | PRN

## 2021-12-20 MED ORDER — ACETAMINOPHEN 500 MG PO TABS: 1000.0000 mg | ORAL_TABLET | Freq: Four times a day (QID) | ORAL | Status: DC | PRN

## 2021-12-20 MED ORDER — SODIUM CHLORIDE 0.9 % IV SOLN
100.0000 mg | Freq: Once | INTRAVENOUS | Status: DC
  Filled 2021-12-20: qty 5

## 2021-12-20 MED ORDER — SODIUM CHLORIDE 0.9 % IV SOLN
200.0000 mg | Freq: Once | INTRAVENOUS | Status: AC
Start: 2021-12-20 — End: 2021-12-20
  Administered 2021-12-20: 200 mg via INTRAVENOUS
  Filled 2021-12-20: qty 200

## 2021-12-20 MED ORDER — BETAMETHASONE SOD PHOS & ACET 6 (3-3) MG/ML IJ SUSP
12.0000 mg | Freq: Once | INTRAMUSCULAR | Status: AC
  Administered 2021-12-20: 12 mg via INTRAMUSCULAR
  Filled 2021-12-20: qty 5

## 2021-12-20 NOTE — Discharge Summary (Signed)
Maria Gentry is a 26 y.o. female. She is at [redacted]w[redacted]d gestation. Patient's last menstrual period was 04/21/2021 (exact date). Estimated Date of Delivery: 01/28/22  Prenatal care site: [facility]  Chief complaint: scheduled betamethasone injection   HPI: [name] presents to L&D to receive scheduled 2nd betamethasone injection.  PreOp labs collected today along with IV iron infusion.  Reports irregular mild contractions, denies LOF, vaginal bleeding.  Endorses good fetal movement.   Factors complicating pregnancy: Mono/di twin gestation  History of anxiety and depression  Hemoglobin-C Previous LTCS Maternal iron deficiency anemia  Varicella non-immune   S: Resting comfortably. no VB.no LOF,  Active fetal movement.   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    Past Surgical Hx:  has a past surgical history that includes Eye surgery; Hymenectomy (12/2015); and Cesarean section (N/A, 12/18/2018).   No Known Allergies   Prior to Admission medications   Medication Sig Start Date End Date Taking? Authorizing Provider  aspirin EC 81 MG tablet Take 81 mg by mouth daily. Swallow whole.    [provider]  Docusate Sodium (COLACE PO) Take 2 capsules by mouth daily.    [provider]  famotidine (PEPCID) 10 MG tablet Take 10 mg by mouth daily.    [provider]  magnesium gluconate (MAGONATE) 500 MG tablet Take 200 mg by mouth daily.    [provider]  Prenatal Multivit-Min-Fe-FA (PRE-NATAL PO) Take 2 capsules by mouth daily.    [provider]    Social History: She  reports that she has never smoked. She has never used smokeless tobacco. She reports that she does not drink alcohol and  does not use drugs.  Family History: family history includes Ovarian cancer in an other family member; Skin cancer in her mother.   Review of Systems: A full review of systems was performed and negative except as noted in the HPI.    O:  BP 112/64   Pulse 73   Temp 98.3 F (36.8 C) (Oral)   LMP 04/21/2021 (Exact Date)  Results for orders placed or performed during the hospital encounter of 12/20/21 (from the past 48 hour(s))  CBC   Collection Time: 12/20/21  9:00 AM  Result Value Ref Range   WBC 9.8 4.0 - 10.5 K/uL   RBC 3.91 3.87 - 5.11 MIL/uL   Hemoglobin 10.6 (L) 12.0 - 15.0 g/dL   HCT 60.7 (L) 37.1 - 06.2 %   MCV 77.5 (L) 80.0 - 100.0 fL   MCH 27.1 26.0 - 34.0 pg   MCHC 35.0 30.0 - 36.0 g/dL   RDW 69.4 (H) 85.4 - 62.7 %   Platelets 212 150 - 400 K/uL   nRBC 0.0 0.0 - 0.2 %  Type and screen [facility]   Collection Time: 12/20/21  9:00 AM  Result Value Ref Range   ABO/RH(D) PENDING    Antibody Screen PENDING    Sample Expiration      12/23/2021,2359 Performed at [facility], [address] [place], [place] [address]      Constitutional: NAD, AAOx3  HE/ENT: extraocular movements grossly intact, moist mucous membranes CV: RRR PULM: nl respiratory effort Abd: gravid, non-tender, non-distended, soft  Ext: Non-tender, Nonedmeatous Psych: mood appropriate, speech normal Pelvic : deferred  Fetal Monitor:  Baby A: Baseline: 135 bpm Variability: moderate Accels: Present Decels: none Toco: mild contractions Category I:  NST: reactive  Baby B: Baseline: 130 bpm Variability: moderate Accels: Present Decels: none Category: I NST: Reactive    Assessment: 26 y.o. [redacted]w[redacted]d here for antenatal surveillance during pregnancy.  Principle diagnosis: Monochorionic diamniotic twin gestation [O30.039]   Plan: Labor: not present. 2nd betamethasone given  Iron transfusion given  PreOP labs drawn  NST reviewed - reactive x 2 Fetal  Wellbeing: Reassuring Cat 1 tracing x 2 D/c home stable, precautions reviewed, follow-up as scheduled tomorrow.   ----- Margaretmary Eddy, CNM Certified Nurse Midwife [facility]

## 2021-12-21 LAB — RPR: RPR Ser Ql: NONREACTIVE

## 2021-12-21 MED ORDER — BUPIVACAINE 0.25 % ON-Q PUMP DUAL CATH 400 ML
400.0000 mL | INJECTION | Status: DC
  Filled 2021-12-21: qty 400

## 2021-12-21 MED ORDER — LACTATED RINGERS IV SOLN: INTRAVENOUS | Status: DC

## 2021-12-21 MED ORDER — CHLORHEXIDINE GLUCONATE 0.12 % MT SOLN
15.0000 mL | Freq: Once | OROMUCOSAL | Status: AC
  Administered 2021-12-22: 15 mL via OROMUCOSAL
  Filled 2021-12-21: qty 15

## 2021-12-21 MED ORDER — SOD CITRATE-CITRIC ACID 500-334 MG/5ML PO SOLN: 30.0000 mL | ORAL | Status: AC

## 2021-12-21 MED ORDER — ORAL CARE MOUTH RINSE: 15.0000 mL | Freq: Once | OROMUCOSAL | Status: AC

## 2021-12-21 MED ORDER — BUPIVACAINE HCL (PF) 0.5 % IJ SOLN
5.0000 mL | Freq: Once | INTRAMUSCULAR | Status: DC
  Filled 2021-12-21: qty 10

## 2021-12-21 MED ORDER — CEFAZOLIN SODIUM-DEXTROSE 2-4 GM/100ML-% IV SOLN
2.0000 g | INTRAVENOUS | Status: AC
  Administered 2021-12-22: 2 g via INTRAVENOUS
  Filled 2021-12-21: qty 100

## 2021-12-22 ENCOUNTER — Inpatient Hospital Stay: Payer: Medicaid Other | Admitting: Anesthesiology

## 2021-12-22 ENCOUNTER — Encounter
Admission: RE | Disposition: A | Discharge status: Home / Self Care | Payer: Self-pay | Source: Ambulatory Visit | Attending: Obstetrics and Gynecology

## 2021-12-22 ENCOUNTER — Encounter: Payer: Self-pay | Admitting: Obstetrics and Gynecology

## 2021-12-22 ENCOUNTER — Inpatient Hospital Stay
Admission: RE | Admit: 2021-12-22 | Discharge: 2021-12-25 | DRG: 788 | Disposition: A | Discharge status: Home / Self Care | Payer: Medicaid Other | Source: Ambulatory Visit | Attending: Obstetrics and Gynecology | Admitting: Obstetrics and Gynecology

## 2021-12-22 ENCOUNTER — Other Ambulatory Visit: Payer: Self-pay

## 2021-12-22 DIAGNOSIS — O0993 Supervision of high risk pregnancy, unspecified, third trimester: Secondary | ICD-10-CM

## 2021-12-22 DIAGNOSIS — O30031 Twin pregnancy, monochorionic/diamniotic, first trimester: Secondary | ICD-10-CM

## 2021-12-22 DIAGNOSIS — D62 Acute posthemorrhagic anemia: Secondary | ICD-10-CM | POA: Diagnosis not present

## 2021-12-22 DIAGNOSIS — O9081 Anemia of the puerperium: Secondary | ICD-10-CM | POA: Diagnosis not present

## 2021-12-22 DIAGNOSIS — Z3480 Encounter for supervision of other normal pregnancy, unspecified trimester: Principal | ICD-10-CM

## 2021-12-22 DIAGNOSIS — Z98891 History of uterine scar from previous surgery: Secondary | ICD-10-CM

## 2021-12-22 DIAGNOSIS — O9902 Anemia complicating childbirth: Secondary | ICD-10-CM | POA: Diagnosis present

## 2021-12-22 DIAGNOSIS — O34211 Maternal care for low transverse scar from previous cesarean delivery: Principal | ICD-10-CM | POA: Diagnosis present

## 2021-12-22 DIAGNOSIS — O321XX2 Maternal care for breech presentation, fetus 2: Secondary | ICD-10-CM | POA: Diagnosis present

## 2021-12-22 DIAGNOSIS — D582 Other hemoglobinopathies: Secondary | ICD-10-CM | POA: Diagnosis present

## 2021-12-22 DIAGNOSIS — O30033 Twin pregnancy, monochorionic/diamniotic, third trimester: Secondary | ICD-10-CM | POA: Diagnosis present

## 2021-12-22 DIAGNOSIS — O99019 Anemia complicating pregnancy, unspecified trimester: Secondary | ICD-10-CM | POA: Diagnosis present

## 2021-12-22 DIAGNOSIS — Z3A34 34 weeks gestation of pregnancy: Secondary | ICD-10-CM | POA: Diagnosis not present

## 2021-12-22 DIAGNOSIS — O365931 Maternal care for other known or suspected poor fetal growth, third trimester, fetus 1: Secondary | ICD-10-CM | POA: Diagnosis present

## 2021-12-22 DIAGNOSIS — O09899 Supervision of other high risk pregnancies, unspecified trimester: Secondary | ICD-10-CM

## 2021-12-22 DIAGNOSIS — O30039 Twin pregnancy, monochorionic/diamniotic, unspecified trimester: Secondary | ICD-10-CM | POA: Diagnosis present

## 2021-12-22 DIAGNOSIS — D509 Iron deficiency anemia, unspecified: Secondary | ICD-10-CM | POA: Diagnosis present

## 2021-12-22 DIAGNOSIS — O99013 Anemia complicating pregnancy, third trimester: Secondary | ICD-10-CM

## 2021-12-22 SURGERY — Surgical Case
Anesthesia: Spinal

## 2021-12-22 MED ORDER — PRENATAL MULTIVITAMIN CH
1.0000 | ORAL_TABLET | Freq: Every day | ORAL | Status: DC
  Administered 2021-12-25: 1 via ORAL
  Filled 2021-12-22 (×2): qty 1

## 2021-12-22 MED ORDER — ONDANSETRON HCL 4 MG/2ML IJ SOLN
4.0000 mg | Freq: Three times a day (TID) | INTRAMUSCULAR | Status: DC | PRN
  Administered 2021-12-22: 4 mg via INTRAVENOUS
  Filled 2021-12-22: qty 2

## 2021-12-22 MED ORDER — FERROUS SULFATE 325 (65 FE) MG PO TABS
325.0000 mg | ORAL_TABLET | Freq: Two times a day (BID) | ORAL | Status: DC
  Administered 2021-12-23 – 2021-12-25 (×5): 325 mg via ORAL
  Filled 2021-12-22 (×5): qty 1

## 2021-12-22 MED ORDER — NALOXONE HCL 0.4 MG/ML IJ SOLN: 0.4000 mg | INTRAMUSCULAR | Status: DC | PRN

## 2021-12-22 MED ORDER — MORPHINE SULFATE (PF) 0.5 MG/ML IJ SOLN
INTRAMUSCULAR | Status: AC
  Filled 2021-12-22: qty 10

## 2021-12-22 MED ORDER — DIPHENHYDRAMINE HCL 50 MG/ML IJ SOLN: 12.5000 mg | INTRAMUSCULAR | Status: DC | PRN

## 2021-12-22 MED ORDER — OXYCODONE-ACETAMINOPHEN 5-325 MG PO TABS: 1.0000 | ORAL_TABLET | ORAL | Status: DC | PRN

## 2021-12-22 MED ORDER — MEPERIDINE HCL 25 MG/ML IJ SOLN: 6.2500 mg | INTRAMUSCULAR | Status: DC | PRN

## 2021-12-22 MED ORDER — KETOROLAC TROMETHAMINE 30 MG/ML IJ SOLN
30.0000 mg | Freq: Four times a day (QID) | INTRAMUSCULAR | Status: AC | PRN
  Administered 2021-12-22 – 2021-12-23 (×3): 30 mg via INTRAVENOUS
  Filled 2021-12-22 (×3): qty 1

## 2021-12-22 MED ORDER — MORPHINE SULFATE (PF) 0.5 MG/ML IJ SOLN
INTRAMUSCULAR | Status: DC | PRN
  Administered 2021-12-22: .1 mg via INTRATHECAL

## 2021-12-22 MED ORDER — ACETAMINOPHEN 500 MG PO TABS
1000.0000 mg | ORAL_TABLET | Freq: Four times a day (QID) | ORAL | Status: AC
  Administered 2021-12-23: 1000 mg via ORAL
  Filled 2021-12-22 (×3): qty 2

## 2021-12-22 MED ORDER — SODIUM CHLORIDE 0.9% FLUSH: 3.0000 mL | INTRAVENOUS | Status: DC | PRN

## 2021-12-22 MED ORDER — KETOROLAC TROMETHAMINE 30 MG/ML IJ SOLN: 30.0000 mg | Freq: Four times a day (QID) | INTRAMUSCULAR | Status: AC | PRN

## 2021-12-22 MED ORDER — FENTANYL CITRATE (PF) 100 MCG/2ML IJ SOLN: 25.0000 ug | INTRAMUSCULAR | Status: DC | PRN

## 2021-12-22 MED ORDER — BUPIVACAINE HCL (PF) 0.5 % IJ SOLN
INTRAMUSCULAR | Status: DC | PRN
  Administered 2021-12-22: 10 mL

## 2021-12-22 MED ORDER — ONDANSETRON HCL 4 MG/2ML IJ SOLN: 4.0000 mg | Freq: Once | INTRAMUSCULAR | Status: DC | PRN

## 2021-12-22 MED ORDER — VARICELLA VIRUS VACCINE LIVE 1350 PFU/0.5ML IJ SUSR
0.5000 mL | INTRAMUSCULAR | Status: DC | PRN
  Filled 2021-12-22: qty 0.5

## 2021-12-22 MED ORDER — MENTHOL 3 MG MT LOZG: 1.0000 | LOZENGE | OROMUCOSAL | Status: DC | PRN

## 2021-12-22 MED ORDER — DIPHENHYDRAMINE HCL 25 MG PO CAPS: 25.0000 mg | ORAL_CAPSULE | ORAL | Status: DC | PRN

## 2021-12-22 MED ORDER — OXYTOCIN-SODIUM CHLORIDE 30-0.9 UT/500ML-% IV SOLN
INTRAVENOUS | Status: DC | PRN
  Administered 2021-12-22: 300 mL via INTRAVENOUS

## 2021-12-22 MED ORDER — WITCH HAZEL-GLYCERIN EX PADS: 1.0000 "application " | MEDICATED_PAD | CUTANEOUS | Status: DC | PRN

## 2021-12-22 MED ORDER — LACTATED RINGERS IV BOLUS
1000.0000 mL | Freq: Once | INTRAVENOUS | Status: AC
  Administered 2021-12-22: 1000 mL via INTRAVENOUS

## 2021-12-22 MED ORDER — OXYTOCIN-SODIUM CHLORIDE 30-0.9 UT/500ML-% IV SOLN
2.5000 [IU]/h | INTRAVENOUS | Status: AC
  Administered 2021-12-22: 2.5 [IU]/h via INTRAVENOUS

## 2021-12-22 MED ORDER — SIMETHICONE 80 MG PO CHEW
80.0000 mg | CHEWABLE_TABLET | Freq: Three times a day (TID) | ORAL | Status: DC
  Administered 2021-12-23 – 2021-12-25 (×8): 80 mg via ORAL
  Filled 2021-12-22 (×8): qty 1

## 2021-12-22 MED ORDER — SOD CITRATE-CITRIC ACID 500-334 MG/5ML PO SOLN
ORAL | Status: AC
  Administered 2021-12-22: 30 mL via ORAL
  Filled 2021-12-22: qty 15

## 2021-12-22 MED ORDER — ONDANSETRON HCL 4 MG/2ML IJ SOLN
INTRAMUSCULAR | Status: DC | PRN
  Administered 2021-12-22: 4 mg via INTRAVENOUS

## 2021-12-22 MED ORDER — FENTANYL CITRATE (PF) 100 MCG/2ML IJ SOLN
INTRAMUSCULAR | Status: DC | PRN
  Administered 2021-12-22: 15 ug via INTRAVENOUS

## 2021-12-22 MED ORDER — FENTANYL CITRATE (PF) 100 MCG/2ML IJ SOLN
INTRAMUSCULAR | Status: DC | PRN
  Administered 2021-12-22: 85 ug via INTRAVENOUS

## 2021-12-22 MED ORDER — NALOXONE HCL 4 MG/10ML IJ SOLN: 1.0000 ug/kg/h | INTRAVENOUS | Status: DC | PRN

## 2021-12-22 MED ORDER — PHENYLEPHRINE HCL-NACL 20-0.9 MG/250ML-% IV SOLN
INTRAVENOUS | Status: DC | PRN
  Administered 2021-12-22: 40 ug/min via INTRAVENOUS

## 2021-12-22 MED ORDER — SENNOSIDES-DOCUSATE SODIUM 8.6-50 MG PO TABS
2.0000 | ORAL_TABLET | ORAL | Status: DC
  Administered 2021-12-23 – 2021-12-25 (×2): 2 via ORAL
  Filled 2021-12-22 (×2): qty 2

## 2021-12-22 MED ORDER — COCONUT OIL OIL: 1.0000 "application " | TOPICAL_OIL | Status: DC | PRN

## 2021-12-22 MED ORDER — 0.9 % SODIUM CHLORIDE (POUR BTL) OPTIME
TOPICAL | Status: DC | PRN
  Administered 2021-12-22: 450 mL

## 2021-12-22 MED ORDER — DIBUCAINE (PERIANAL) 1 % EX OINT: 1.0000 "application " | TOPICAL_OINTMENT | CUTANEOUS | Status: DC | PRN

## 2021-12-22 MED ORDER — KETOROLAC TROMETHAMINE 30 MG/ML IJ SOLN
INTRAMUSCULAR | Status: DC | PRN
  Administered 2021-12-22: 30 mg via INTRAVENOUS

## 2021-12-22 MED ORDER — FENTANYL CITRATE (PF) 100 MCG/2ML IJ SOLN
INTRAMUSCULAR | Status: AC
  Filled 2021-12-22: qty 2

## 2021-12-22 MED ORDER — GLYCOPYRROLATE 0.2 MG/ML IJ SOLN
INTRAMUSCULAR | Status: DC | PRN
  Administered 2021-12-22: .2 mg via INTRAVENOUS

## 2021-12-22 MED ORDER — DIPHENHYDRAMINE HCL 25 MG PO CAPS: 25.0000 mg | ORAL_CAPSULE | Freq: Four times a day (QID) | ORAL | Status: DC | PRN

## 2021-12-22 MED ORDER — LACTATED RINGERS IV SOLN: INTRAVENOUS | Status: DC

## 2021-12-22 MED ORDER — IBUPROFEN 600 MG PO TABS
600.0000 mg | ORAL_TABLET | Freq: Four times a day (QID) | ORAL | Status: DC
  Administered 2021-12-23 – 2021-12-25 (×9): 600 mg via ORAL
  Filled 2021-12-22 (×9): qty 1

## 2021-12-22 MED ORDER — OXYCODONE-ACETAMINOPHEN 5-325 MG PO TABS: 2.0000 | ORAL_TABLET | ORAL | Status: DC | PRN

## 2021-12-22 MED ORDER — TRANEXAMIC ACID 1000 MG/10ML IV SOLN
INTRAVENOUS | Status: DC | PRN
  Administered 2021-12-22: 1000 mg via INTRAVENOUS

## 2021-12-22 MED ORDER — OXYTOCIN-SODIUM CHLORIDE 30-0.9 UT/500ML-% IV SOLN
INTRAVENOUS | Status: AC
  Filled 2021-12-22: qty 500

## 2021-12-22 MED ORDER — TRANEXAMIC ACID-NACL 1000-0.7 MG/100ML-% IV SOLN
INTRAVENOUS | Status: AC
  Filled 2021-12-22: qty 100

## 2021-12-22 MED ORDER — BUPIVACAINE IN DEXTROSE 0.75-8.25 % IT SOLN
INTRATHECAL | Status: DC | PRN
  Administered 2021-12-22: 1.6 mL via INTRATHECAL

## 2021-12-22 SURGICAL SUPPLY — 33 items
BACTOSHIELD CHG 4% 4OZ (MISCELLANEOUS) ×1
CATH KIT ON-Q SILVERSOAK 5 (CATHETERS) ×2 IMPLANT
CATH KIT ON-Q SILVERSOAK 5IN (CATHETERS) ×4 IMPLANT
DERMABOND ADVANCED (GAUZE/BANDAGES/DRESSINGS) ×2
DERMABOND ADVANCED .7 DNX12 (GAUZE/BANDAGES/DRESSINGS) ×1 IMPLANT
DRSG OPSITE POSTOP 4X10 (GAUZE/BANDAGES/DRESSINGS) ×2 IMPLANT
DRSG TELFA 3X8 NADH (GAUZE/BANDAGES/DRESSINGS) ×2 IMPLANT
ELECT CAUTERY BLADE 6.4 (BLADE) ×2 IMPLANT
ELECT REM PT RETURN 9FT ADLT (ELECTROSURGICAL) ×2
ELECTRODE REM PT RTRN 9FT ADLT (ELECTROSURGICAL) ×1 IMPLANT
GAUZE SPONGE 4X4 12PLY STRL (GAUZE/BANDAGES/DRESSINGS) ×2 IMPLANT
GLOVE BIO SURGEON STRL SZ7 (GLOVE) ×2 IMPLANT
GLOVE SURG UNDER LTX SZ7.5 (GLOVE) ×2 IMPLANT
GOWN STRL REUS W/ TWL LRG LVL3 (GOWN DISPOSABLE) ×3 IMPLANT
GOWN STRL REUS W/TWL LRG LVL3 (GOWN DISPOSABLE) ×6
MANIFOLD NEPTUNE II (INSTRUMENTS) ×2 IMPLANT
MAT PREVALON FULL STRYKER (MISCELLANEOUS) ×2 IMPLANT
NS IRRIG 1000ML POUR BTL (IV SOLUTION) ×2 IMPLANT
PACK C SECTION AR (MISCELLANEOUS) ×2 IMPLANT
PAD DRESSING TELFA 3X8 NADH (GAUZE/BANDAGES/DRESSINGS) ×1 IMPLANT
PAD OB MATERNITY 4.3X12.25 (PERSONAL CARE ITEMS) ×4 IMPLANT
PAD PREP 24X41 OB/GYN DISP (PERSONAL CARE ITEMS) ×2 IMPLANT
SCRUB CHG 4% DYNA-HEX 4OZ (MISCELLANEOUS) ×1 IMPLANT
STRIP CLOSURE SKIN 1/2X4 (GAUZE/BANDAGES/DRESSINGS) ×2 IMPLANT
SUT MNCRL 4-0 (SUTURE) ×2
SUT MNCRL 4-0 27XMFL (SUTURE) ×1
SUT PDS AB 1 TP1 96 (SUTURE) ×2 IMPLANT
SUT PLAIN GUT 0 (SUTURE) IMPLANT
SUT VIC AB 0 CTX 36 (SUTURE) ×4
SUT VIC AB 0 CTX36XBRD ANBCTRL (SUTURE) ×2 IMPLANT
SUTURE MNCRL 4-0 27XMF (SUTURE) ×1 IMPLANT
SWABSTK COMLB BENZOIN TINCTURE (MISCELLANEOUS) ×2 IMPLANT
WATER STERILE IRR 500ML POUR (IV SOLUTION) ×2 IMPLANT

## 2021-12-22 NOTE — Transfer of Care (Signed)
Immediate Anesthesia Transfer of Care Note  Patient: Maria Gentry  Procedure(s) Performed: REPEAT CESAREAN SECTION - TWINS  Patient Location: LDR6  Anesthesia Type:Spinal  Level of Consciousness: awake, alert  and oriented  Airway & Oxygen Therapy: Patient Spontanous Breathing  Post-op Assessment: Report given to RN and Post -op Vital signs reviewed and stable  Post vital signs: Reviewed and stable  Last Vitals:  Vitals Value Taken Time  BP 120/101   Temp    Pulse 96   Resp 12   SpO2 98     Last Pain:  Vitals:   12/22/21 0550  TempSrc: Axillary  PainSc:          Complications: No notable events documented.

## 2021-12-22 NOTE — Discharge Summary (Signed)
Postpartum Discharge Summary    Patient Name: Maria Gentry DOB: 15-Sep-1995 MRN: [id number]  Date of admission: 12/22/2021 Delivery date:   Shanisha, Lech [[id number]]  12/22/2021    Sophie, Tamez [[id number]]  12/22/2021 Delivering provider:    Victoriano Lain Girl Sacheen [[id number]]  Eutha, Cude [place] [[id number]]  Prentice Docker D Date of discharge: 12/25/2021  Admitting diagnosis: History of cesarean delivery [Z98.891] Intrauterine pregnancy: [redacted]w[redacted]d     Secondary diagnosis:  Principal Problem:   History of cesarean delivery Active Problems:   Hemoglobin C trait ([place])   Iron deficiency anemia of mother during pregnancy   Anemia affecting pregnancy in third trimester   IUGR (intrauterine growth restriction) affecting care of mother, third trimester, fetus 1   Maternal varicella, non-immune   Supervision of high-risk pregnancy, third trimester   Monochorionic diamniotic twin gestation   [redacted] weeks gestation of pregnancy  Additional problems: None    Discharge diagnosis: Preterm Pregnancy Delivered                                              Post partum procedures: None Augmentation: N/A Complications: None  Hospital course: Sceduled C/S   26 y.o. yo G2P1103 at [redacted]w[redacted]d was admitted to the hospital 12/22/2021 for scheduled cesarean section with the following indication:Elective Repeat, Multifetal Gestation, and fetal growth restriction of fetus A .Delivery details are as follows:  Membrane Rupture Time/DateSadiyah, Kangas Girl Turkessa [[id number]]  8:16 AM    Kimberl, Vig [[id number]]  8:20 AM ,   Nyonna, [place] Girl Valynn [[id number]]  12/22/2021    Keiarah, Orlowski [[id number]]  12/22/2021   Delivery Method:   Naylah, Cork [[id number]]  C-Section, Low Transverse    Ilona, Colley [[id number]]  C-Section, Low Transverse Details of operation can be found in separate operative note.  Patient had an uncomplicated  postpartum course.  She is ambulating, tolerating a regular diet, passing flatus, and urinating well. Patient is discharged home in stable condition on  12/25/21        Newborn Data: Birth date:   Neesha, Langton [[id number]]  12/22/2021    Larrissa, Stivers [[id number]]  12/22/2021 Birth time:   Daron, Breeding [[id number]]  8:17 AM    Cissy, Galbreath [[id number]]  8:21 AM Gender:   Amaree, Loisel [[id number]]  Female    Chaquetta, Schlottman [[id number]]  Female Living status:   Sacheen, Arrasmith [[id number]]  Living    Tinamarie, Przybylski [[id number]]  Living Apgars:   Matylda, Fehring [[id number]]  8    Elaijah, Munoz [[id number]]  8 ,   Amanpreet, [place] Girl [place] [[id number]]  9    Jacqulynn, [place]ell [place] [[id number]]  9 Weight:   Taneika, Choi Girl Pearl [[id number]]  2395 g    Aarya, Robinson [[id number]]  2450 g     Magnesium Sulfate received: No BMZ received: Yes Rhophylac:N/A MMR:No T-DaP: declined  Flu: N/A Transfusion:No  Physical exam  Vitals:   12/24/21 0935 12/24/21 1536 12/25/21 0023 12/25/21 1057  BP: 109/83 114/81 112/77 105/66  Pulse: 91 65 69 72  Resp: $Remo'20 20 20 18  'uTVjl$ Temp: 98.5 F (36.9 C) 98.4 F (36.9 C) 97.8 F (36.6 C) 97.9 F (36.6 C)  TempSrc:  Oral  Oral  SpO2: 100% 99% 99% 100%  Weight:      Height:       General: alert, cooperative, and no distress Lochia: appropriate Uterine Fundus: firm Incision: Dressing is clean, dry, and intact, covered with occlusive OP site  DVT Evaluation: No evidence of DVT seen on physical exam. Labs: Lab Results  Component Value Date   WBC 13.6 (H) 12/23/2021   HGB 9.1 (L) 12/23/2021   HCT 26.4 (L) 12/23/2021   MCV 79.3 (L) 12/23/2021   PLT 235 12/23/2021      Latest Ref Rng & Units 12/20/2021    9:00 AM  CMP  Glucose 70 - 99 mg/dL 93   BUN 6 - 20 mg/dL 8   Creatinine 0.44 - 1.00 mg/dL 0.50   Sodium 135 - 145 mmol/L 135   Potassium 3.5 - 5.1 mmol/L 3.7    Chloride 98 - 111 mmol/L 106   CO2 22 - 32 mmol/L 20   Calcium 8.9 - 10.3 mg/dL 9.2    Edinburgh Score:    12/23/2021    7:25 PM  Edinburgh Postnatal Depression Scale Screening Tool  I have been able to laugh and see the funny side of things. 0  I have looked forward with enjoyment to things. 0  I have blamed myself unnecessarily when things went wrong. 2  I have been anxious or worried for no good reason. 2  I have felt scared or panicky for no good reason. 1  Things have been getting on top of me. 1  I have been so unhappy that I have had difficulty sleeping. 0  I have felt sad or miserable. 0  I have been so unhappy that I have been crying. 0  The thought of harming myself has occurred to me. 0  Edinburgh Postnatal Depression Scale Total 6      After visit meds:  Allergies as of 12/25/2021   No Known Allergies      Medication List     STOP taking these medications    aspirin EC 81 MG tablet       TAKE these medications    acetaminophen 325 MG tablet Commonly known as: TYLENOL Take 2 tablets (650 mg total) by mouth every 6 (six) hours as needed for moderate pain.   COLACE PO Take 2 capsules by mouth daily.   famotidine 10 MG tablet Commonly known as: PEPCID Take 10 mg by mouth daily.   ibuprofen 600 MG tablet Commonly known as: ADVIL Take 1 tablet (600 mg total) by mouth every 6 (six) hours.   magnesium gluconate 500 MG tablet Commonly known as: MAGONATE Take 200 mg by mouth daily.   oxyCODONE 5 MG immediate release tablet Commonly known as: Oxy IR/ROXICODONE Take 1 tablet (5 mg total) by mouth every 4 (four) hours as needed for up to 7 days for severe pain.   PRE-NATAL PO Take 2 capsules by mouth daily.         Discharge home in stable condition Infant Feeding: Breast Infant Disposition:NICU Discharge instruction: per After Visit Summary and Postpartum booklet. Activity: Advance as tolerated. Pelvic rest for 6 weeks.  Diet: routine  diet Anticipated Birth Control:  vasectomy for partner  Postpartum Appointment:6 weeks Additional Postpartum F/U: Incision check 1 week Future Appointments:No future appointments. Follow up Visit:  Follow-up Information     Will Bonnet, MD. Go in 1 week(s).   Specialty: Obstetrics and Gynecology Why: For incision check Contact information: [address] [place] [id number] [phone]  SIGNED: Drinda Butts, CNM Certified Nurse Midwife [facility]

## 2021-12-22 NOTE — Lactation Note (Addendum)
This note was copied from a baby's chart. Lactation Consultation Note  Patient Name: Maria Gentry [id number] Date: 12/22/2021 Reason for consult: Initial assessment,Late-preterm 34-36.6 weeks, Twin "A" , than 6 lbs, multiple gestation,breastfeeding assistance, RN request  Age:27 hours  Maternal Data Per mom her first baby had latch issues and she had nipple pain and trauma so she exclusively pumped milk for 10 months for the baby. She delivered today by C/S twin baby girls and her plan is to breastfeed them. Per chart review pregnancy was complicated by mono-di twins with fetal growth restriction of fetus. Mom with history of anxiety and depression. Does the patient have breastfeeding experience prior to this delivery?: Yes How long did the patient breastfeed?: Mom pumped for 10 months with her first child.Per mom she has painful damaged nipples in the first few days due to her first baby's latch issues so she exclusively pumped for 10 months.  Feeding Mother's Current Feeding Choice: Breast Milk and Donor Milk This feeding assessment is for Twin "A" Assisted mother with breastfeeding. Provided tips to maximize position and latch techniques. Baby rooting and readily latched well. Baby sustained latch and breastfed for 8 minutes with multiple audible swallows noted. Mother able to recognize swallowing. Per mom latch was comfortable for her. Baby remained autonomically stable throughout.  Twin "B" had breastfeeding attempt but did not latch.  This LATCH Score pertains to Twin "A" Latch: Grasps breast easily, tongue down, lips flanged, rhythmical sucking.  Audible Swallowing: Spontaneous and intermittent  Type of Nipple: Everted at rest and after stimulation  Comfort (Breast/Nipple): Soft / non-tender  Hold (Positioning): Assistance needed to correctly position infant at breast and maintain latch.  LATCH Score: 9   Lactation Tools Discussed  Discussed trouble shooting of "clicking  noise" mom hears when she is breast pumping. Will f/u with mom when she pumps .  Interventions Interventions: Breast feeding basics reviewed;Assisted with latch;Breast massage;Breast compression;Adjust position;Support pillows;Position options;Education (Education re: LPT breastfeeding characteristics and potential for inconsistent breastfeeding) Recommended mom to pump after breastfeeding/breastfeeding attempts  especially if baby does not latch and feed in order to establish her milk supply.   Discharge Discharge Education: Other (comment) (Mom reports she will bring the babies to [facility] for follow-up. Provided mother with information for outpatient LC assistance if she needs more help with breastfeeding after she and the babies go home.) Pump: Personal (Per mom her DEBP is ordered and it has shipped.)  Consult Status Consult Status: Follow-up Date: 12/23/21 Follow-up type: In-patient  Update provided to covering nurse.  Fuller Song 12/22/2021, 2:48 PM

## 2021-12-22 NOTE — H&P (Signed)
OB History & Physical   History of Present Illness:  Chief Complaint: here for repeat cesarean section  HPI:  Maria Gentry is a 26 y.o. G2P1001 female at [redacted]w[redacted]d dated by LMP consistent with a first trimester ultrasound.  Her pregnancy has been complicated by a history of cesarean section, mono-di twins with fetal growth restriction of fetus A, hemoglobin C, anemia .    She reports contractions a couple of days ago.   She denies leakage of fluid.   She denies vaginal bleeding.   She reports fetal movement x 2.    Total weight gain for pregnancy: Not found.   Obstetrical Problem List: G2 Problems (from 12/20/21 to present)     Problem Noted Resolved   Supervision of high-risk pregnancy, third trimester 07/29/2021 by Minda Meo, CNM No   Overview Signed 12/20/2021  8:28 AM by Minda Meo, CNM    Formatting of this note is different from the original. 26 y.o. G2P1001 at [redacted]w[redacted]d by [redacted]w[redacted]d ultrasound and inconsistent with LMP of 04/21/21. Estimated Date of Delivery: 01/28/22  Sex of baby and name: Both girls "Cora and [name] "   FOB: Ysidro Evert      Factors complicating this pregnancy   Repeat c/s scheduled for 12/22/21 with [facility]  1. H/o mental health diagnoses  Dx: Anxiety and Depression  Medications prior to pregnancy: never  Medical team following her: n/a  Medications during pregnancy: none  Counseling: none 2. Hgb C  UA and Culture every trimester  FOB testing - negative for trait   3. Previous C/S x1  C/s done for: failed induction  Delivered by Dr. Glennon Mac - records in [place]  [] ? Pre-op completed with:___on:___  Delivery preference: repeat c/s (may desire a VBAC if she goes into labor on her own)  4. Varicella NON-Immune  5. Multi-fetal gestation - Mono/Di twins  [x] ? Referral to MFM sent 06/23/21 ? Recommendations: ? U/s scheduled with MFM every 2 weeks ? Fetal echo ordered by MFM  Genetic Screening - completed with MFM  [x] ? ASA started at 12 weeks  Twice  weekly NST with AFI beginning at 32 weeks  BMTZ 06/04 and 06/05, delivery on 06/06 @ 34wks d/t IUGR  6. Anemia in pregnancy  9.3 Hemoglobin  Added on ferritin 8  Started on twice daily iron  Supplement  12/18/21 - hgb 9.7 - iron transfusion x 1 scheduled for 12/20/21 (LTCS scheduled for 12/22/21)  Screening results and needs:  NOB:   Medicaid Questionnaire: done 08/03/21  []  ACHD Program  Depression Score: 5  MBT: B Pos  Ab screen: Neg    HIV: Neg  Hep B: Neg RPR: NR  Pap: 03/14/20 NILM G/C: Neg/Neg  Rubella: Immune   VZV: Non-Immune  TSH:0.465  HgA1C: 4.6  Aneuploidy:   First trimester:   MaternitT21:     Second trimester (AFP/tetra):   28 weeks:   Review Medicaid Questionnaire:  []  ACHD Program  Depression Score:  Blood consent:  Hgb:9.3   Platelets: 265   Glucola:117   Rhogam:n/a   36 weeks:   GBS:   G/C:   Hgb:  Platelets:    HIV: RPR:     Last [place]:   06/23/21: Mono/Di Twin Gestation Fetus A: Viable IUP, S=[redacted]w[redacted]d; EDD by u/s=01/28/2022, FHR=167bpm, Cervical length=5.04cm, Yolk sac and amnion seen, No free fluid seen, Rt ovary appears wnl, Lt ovary appears wnl Fetus B: Viable IUP, S=[redacted]w[redacted]d, FHR=175bpm, Yolk sac and amnion seen  07/24/21: Twin gestation at  13 wks     Sac membrane seen   ??? One  or two  Ant plac Baby a, Inferior  trv head to rt  On mat rt, Crl=6.82 cm= 13.1 wks, Fhr=163 bpm, Ant plac, Cx lg=5.42 cm Baby b Superior   trv head to rt  On mat rt, Crl=6.79 cm= 13.1 wks, Fhr=152 bpm, Ant plac      Immunization:    Flu in season -   Tdap at 27-36 weeks - Medicaid/Will need to [facility] for immunization.  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 Authorizing Provider  aspirin EC 81 MG tablet Take 81 mg by mouth daily. Swallow whole.   Yes [provider]  Docusate Sodium (COLACE PO) Take 2 capsules by mouth daily.   Yes [provider]  famotidine (PEPCID) 10 MG tablet Take 10 mg by mouth daily.   Yes [provider]  magnesium gluconate (MAGONATE) 500 MG tablet Take 200 mg by mouth daily.   Yes [provider]  Prenatal Multivit-Min-Fe-FA (PRE-NATAL PO) Take 2 capsules by mouth daily.   Yes [provider]    OB History  Gravida Para Term Preterm AB Living  2 1 1  0 0 1  SAB IAB Ectopic Multiple Live Births  0 0 0 0 1    # Outcome Date GA Lbr Len/2nd Weight Sex Delivery Anes PTL Lv  2 Current           1 Term 12/18/18 [redacted]w[redacted]d  3930 g M CS-LTranv EPI, Gen, Spinal  LIV    Prenatal care site: [facility]  Social History: She  reports that she has never smoked. She has never used smokeless tobacco. She reports that she does not drink alcohol and does not use drugs.  Family History: family history includes Ovarian cancer in an other family member; Skin cancer in her mother.   Review of Systems  Constitutional: Negative.   HENT: Negative.    Eyes: Negative.   Respiratory: Negative.    Cardiovascular: Negative.   Gastrointestinal: Negative.   Genitourinary: Negative.   Musculoskeletal: Negative.   Skin: Negative.   Neurological: Negative.   Psychiatric/Behavioral: Negative.       Physical Exam:  BP (!) 99/59 (BP Location: Left Arm)   Pulse 69   Temp 98.7 F (37.1 C) (Axillary)   Resp 18   Ht 5' (1.524 m)   Wt 70.3 kg   LMP 04/21/2021 (Exact Date)   BMI 30.27 kg/m   Physical Exam Constitutional:      General: She is not in acute distress.  Appearance: Normal appearance. She is well-developed.  HENT:     Head: Normocephalic and atraumatic.  Eyes:     General: No scleral icterus.    Conjunctiva/sclera: Conjunctivae normal.  Cardiovascular:     Rate and Rhythm: Normal rate and regular rhythm.     Heart sounds: No murmur heard.   No friction rub. No gallop.  Pulmonary:     Effort: Pulmonary effort is normal. No respiratory distress.     Breath sounds: Normal breath sounds. No wheezing or rales.  Abdominal:     General: Bowel sounds are normal. There is no distension.     Palpations: Abdomen is soft. There is mass (gravid).     Tenderness: There is no abdominal tenderness. There is no guarding or rebound.  Musculoskeletal:        General: Normal range of motion.     Cervical back: Normal range of motion and neck supple.  Neurological:     General: No focal deficit present.     Mental Status: She is alert and oriented to person, place, and time.     Cranial Nerves: No cranial nerve deficit.  Skin:    General: Skin is warm and dry.     Findings: No erythema.  Psychiatric:        Mood and Affect: Mood normal.        Behavior: Behavior normal.        Judgment: Judgment normal.     Baseline FHR: 140 beats/min   Variability: moderate   Accelerations: present   Decelerations: absent Contractions: irritability Overall assessment: cat 1  Assessment:  Jannat C Sherrin is a 26 y.o. G45P1001 female at [redacted]w[redacted]d with mono-di twins with growth restriction of fetus A.   Plan:  Admit to Labor & Delivery  CBC, T&S, NPO, IVF Fetwal well-being: reassuring, though I can't tell for sure if both fetuses were being traced as the tracings overlap quite a bit. To  OR for repeat c-section. Personally consented by me.  All questions answered.    Prentice Docker, MD 12/22/2021 7:23 AM

## 2021-12-22 NOTE — Anesthesia Procedure Notes (Signed)
Spinal  Patient location during procedure: OR Start time: 12/22/2021 7:42 AM End time: 12/22/2021 7:51 AM Reason for block: surgical anesthesia Staffing Performed: resident/CRNA  Anesthesiologist: Molli Barrows, MD Resident/CRNA: Aline Brochure, CRNA Preanesthetic Checklist Completed: patient identified, IV checked, site marked, risks and benefits discussed, surgical consent, monitors and equipment checked, pre-op evaluation and timeout performed Spinal Block Patient position: sitting Prep: ChloraPrep Patient monitoring: heart rate, continuous pulse ox, blood pressure and cardiac monitor Approach: midline Location: L3-4 Injection technique: single-shot Needle Needle type: Whitacre and Introducer  Needle gauge: 24 G Needle length: 9 cm Assessment Sensory level: T10 Events: CSF return Additional Notes Sterile aseptic technique used throughout the procedure.  Negative paresthesia. Negative blood return. Positive free-flowing CSF. Expiration date of kit checked and confirmed. Patient tolerated procedure well, without complications.

## 2021-12-22 NOTE — Anesthesia Preprocedure Evaluation (Signed)
Anesthesia Evaluation  Patient identified by MRN, date of birth, ID band Patient awake    Reviewed: Allergy & Precautions, H&P , NPO status , Patient's Chart, lab work & pertinent test results, reviewed documented beta blocker date and time   Airway Mallampati: II  TM Distance: >3 FB Neck ROM: full    Dental no notable dental hx. (+) Teeth Intact   Pulmonary    Pulmonary exam normal breath sounds clear to auscultation       Cardiovascular Exercise Tolerance: Good negative cardio ROS   Rhythm:regular Rate:Normal     Neuro/Psych Anxiety negative neurological ROS  negative psych ROS   GI/Hepatic negative GI ROS, Neg liver ROS,   Endo/Other  negative endocrine ROSdiabetes, Well Controlled, Gestational  Renal/GU      Musculoskeletal   Abdominal   Peds  Hematology  (+) Blood dyscrasia, anemia ,   Anesthesia Other Findings   Reproductive/Obstetrics (+) Pregnancy                             Anesthesia Physical Anesthesia Plan  ASA: 2  Anesthesia Plan: Spinal   Post-op Pain Management:    Induction:   PONV Risk Score and Plan: 3  Airway Management Planned:   Additional Equipment:   Intra-op Plan:   Post-operative Plan:   Informed Consent: I have reviewed the patients History and Physical, chart, labs and discussed the procedure including the risks, benefits and alternatives for the proposed anesthesia with the patient or authorized representative who has indicated his/her understanding and acceptance.       Plan Discussed with:   Anesthesia Plan Comments:         Anesthesia Quick Evaluation

## 2021-12-22 NOTE — Op Note (Signed)
Cesarean Section Operative Note    Patient Name: Maria Gentry  DOB: 22-Oct-1995  MRN: [id number]  Date of Surgery: 12/22/2021   Pre-operative Diagnosis:  1) History of cesarean delivery, desires repeat 2) monochorionic-diamniotic twin gestation 3) fetal growth restriction of twin A, third trimester 4) intrauterine pregnancy at [redacted]w[redacted]d   Post-operative Diagnosis:  1) History of cesarean delivery, desires repeat 2) monochorionic-diamniotic twin gestation 3) fetal growth restriction of twin A, third trimester 4) intrauterine pregnancy at [redacted]w[redacted]d    Procedure: Repeat low transverse cesarean section via Pfannenstiel incision  Surgeon: Surgeon(s) and Role:    * Conard Novak, MD - Primary   Assistants: Harrington Challenger, CNM; No other capable assistant available, in surgery requiring high level assistant.  Anesthesia: spinal   Findings:  1) normal appearing gravid uterus, fallopian tubes, and ovaries with fallopian tubes mildly adherent to the ovaries bilaterally 2) viable female infants with twin A in cephalic presentation with weight of 5 lb 4.5 oz, APGARs of 8 and 9, Female infant B in complete breech presentation with weight of 5 lb 6.4 oz, APGARs 8 and 9   Estimated Blood Loss: 900 mL Quantified Blood Loss: 1,250 mL  Total IV Fluids: 1,0000 ml   Urine Output:  not yet calculated. Clear urine in urimeter  Specimens: placentae   Complications: no complications  Disposition: PACU - hemodynamically stable.   Maternal Condition: stable   Baby condition / location:  NICU  Procedure Details:  The patient was seen in the Holding Room. The risks, benefits, complications, treatment options, and expected outcomes were discussed with the patient. The patient concurred with the proposed plan, giving informed consent. identified as Maria Gentry and the procedure verified as C-Section Delivery. A Time Out was held and the above information confirmed.   After induction of anesthesia,  the patient was draped and prepped in the usual sterile manner. A Pfannenstiel incision was made and carried down through the subcutaneous tissue to the fascia. Fascial incision was made and extended transversely. The fascia was separated from the underlying rectus tissue superiorly and inferiorly. The peritoneum was identified and entered. Peritoneal incision was extended longitudinally. The bladder flap was bluntly and sharply freed from the lower uterine segment. A low transverse uterine incision was made and the hysterotomy was extended with cranial-caudal tension. Baby A was delivered from cephalic presentation was a 2,396 gram Living newborn infant(s) or Female with Apgar scores of 8 at one minute and 9 at five minutes. The second membrane was ruptured with clear fluid.  Baby B was delivered from the complete breech presentation in the standard fashion using a moist blue towel.  Her weight was 2,449 grams, APGARs 8 and 9.  Cord ph was not sent the umbilical cord was clamped and cut cord blood was not obtained for evaluation. The placenta was removed Intact and appeared velamentous with a clot on the surface of placenta B.  The placentae were marked for sending to pathology. The uterine outline, tubes and ovaries appeared normal with the noted fact that the fallopian tubes were mildly adherent to the ovaries bilaterally. The uterine incision was closed with running locked sutures of 0 Vicryl.  A second layer of the same suture was thrown in an imbricating fashion.  Hemostasis was assured.  The uterus was returned to the abdomen and the paracolic gutters were cleared of all clots and debris.  The rectus muscles were inspected and found to be hemostatic.  The On-Q catheter pumps were inserted  in accordance with the manufacturer's recommendations.  The catheters were inserted approximately 4cm cephelad to the incision line, approximately 1cm apart, straddling the midline.  They were inserted to a depth of the  4th mark. They were positioned superficial to the rectus abdominus muscles and deep to the rectus fascia.    The fascia was then reapproximated with running sutures of 1-0 PDS, looped. A running 3-0 Vicryl stitch was used to close the subcutaneous tissue.  The subcuticular closure was performed using 4-0 monocryl. The skin closure was reinforced using surgical skin glue.  The On-Q catheters were bolused with 5 mL of 0.5% marcaine plain for a total of 10 mL.  The catheters were affixed to the skin with surgical skin glue, steri-strips, and tegaderm.    The surgical assistant performed tissue retraction, assistance with suturing, and fundal pressure.  Instrument, sponge, and needle counts were correct prior the abdominal closure and were correct at the conclusion of the case.  The patient received Ancef 2 gram IV prior to skin incision (within 30 minutes). For VTE prophylaxis she was wearing SCDs throughout the case.  The assistant surgeon was a CNM due to lack of availability of another Sales promotion account executive.   Signed: Conard Novak, MD 12/22/2021 9:14 AM

## 2021-12-23 ENCOUNTER — Encounter: Payer: Self-pay | Admitting: Obstetrics and Gynecology

## 2021-12-23 LAB — CBC
HCT: 26.4 % — ABNORMAL LOW (ref 36.0–46.0)
Hemoglobin: 9.1 g/dL — ABNORMAL LOW (ref 12.0–15.0)
MCH: 27.3 pg (ref 26.0–34.0)
MCHC: 34.5 g/dL (ref 30.0–36.0)
MCV: 79.3 fL — ABNORMAL LOW (ref 80.0–100.0)
Platelets: 235 10*3/uL (ref 150–400)
RBC: 3.33 MIL/uL — ABNORMAL LOW (ref 3.87–5.11)
RDW: 17.3 % — ABNORMAL HIGH (ref 11.5–15.5)
WBC: 13.6 10*3/uL — ABNORMAL HIGH (ref 4.0–10.5)
nRBC: 0.1 % (ref 0.0–0.2)

## 2021-12-23 MED ORDER — ACETAMINOPHEN 325 MG PO TABS
650.0000 mg | ORAL_TABLET | Freq: Four times a day (QID) | ORAL | Status: DC | PRN
  Administered 2021-12-23 – 2021-12-25 (×2): 650 mg via ORAL
  Filled 2021-12-23 (×3): qty 2

## 2021-12-23 NOTE — Anesthesia Post-op Follow-up Note (Signed)
  Anesthesia Pain Follow-up Note  Patient: Maria Gentry  Day #: 1  Date of Follow-up: 12/23/2021 Time: 8:29 AM  Last Vitals:  Vitals:   12/23/21 0350 12/23/21 0748  BP: 106/77 107/70  Pulse: 60 63  Resp: 16 18  Temp: 36.5 C 36.5 C  SpO2:  100%    Level of Consciousness: alert  Pain: none   Side Effects:None  Catheter Site Exam:clean, dry, no drainage     Plan: D/C from anesthesia care at surgeon's request  Elmarie Mainland

## 2021-12-23 NOTE — Lactation Note (Signed)
This note was copied from a baby's chart. Lactation Consultation Note  Patient Name: Maria Gentry [id number] Date: 12/23/2021 Reason for consult: Follow-up assessment;Late-preterm 34-36.6wks;Infant < 6lbs;NICU baby;Multiple gestation;Breastfeeding assistance Age:27 hours  Maternal Data Has patient been taught Hand Expression?: Yes Does the patient have breastfeeding experience prior to this delivery?: Yes How long did the patient breastfeed?: 10 months  Feeding Mother's Current Feeding Choice: Breast Milk  Feeding attempt at the breast prior to attempted bottle feed.  Baby brought out of bassinet and blankets, placed in football hold at mom's L breast. Multiple attempts made to get baby to breast; baby remained sleepy and not interested.  LATCH Score Latch: Too sleepy or reluctant, no latch achieved, no sucking elicited.    Lactation Tools Discussed/Used    Interventions Interventions: Breast feeding basics reviewed;Assisted with latch;Skin to skin;Support pillows;Adjust position;Position options;Education  Discharge Pump: Personal  Consult Status Consult Status: NICU follow-up    Danford Bad 12/23/2021, 12:54 PM

## 2021-12-23 NOTE — Anesthesia Postprocedure Evaluation (Signed)
Anesthesia Post Note  Patient: [name]  Procedure(s) Performed: REPEAT CESAREAN SECTION - TWINS  Patient location during evaluation: Mother Baby Anesthesia Type: Spinal Level of consciousness: oriented and awake and alert Pain management: pain level controlled Vital Signs Assessment: post-procedure vital signs reviewed and stable Respiratory status: spontaneous breathing and respiratory function stable Cardiovascular status: blood pressure returned to baseline and stable Postop Assessment: no headache, no backache, no apparent nausea or vomiting and able to ambulate Anesthetic complications: no   No notable events documented.   Last Vitals:  Vitals:   12/23/21 0350 12/23/21 0748  BP: 106/77 107/70  Pulse: 60 63  Resp: 16 18  Temp: 36.5 C 36.5 C  SpO2:  100%    Last Pain:  Vitals:   12/23/21 0748  TempSrc: Oral  PainSc:                  Elmarie Mainland

## 2021-12-23 NOTE — Lactation Note (Signed)
This note was copied from a baby's chart. Lactation Consultation Note  Patient Name: Maria Gentry Date: 12/23/2021 Reason for consult: Follow-up assessment;NICU baby;Late-preterm 34-36.6wks;Infant < 6lbs;Multiple gestation Age:27 hours  Maternal Data Has patient been taught Hand Expression?: Yes Does the patient have breastfeeding experience prior to this delivery?: Yes How long did the patient breastfeed?: 10 months  Feeding Mother's Current Feeding Choice: Breast Milk Nipple Type: Dr. Levert Feinstein Preemie  LC at bedside for Baby B "[name]"  LATCH Score Latch: Repeated attempts needed to sustain latch, nipple held in mouth throughout feeding, stimulation needed to elicit sucking reflex.  Audible Swallowing: Spontaneous and intermittent  Type of Nipple: Everted at rest and after stimulation  Comfort (Breast/Nipple): Soft / non-tender  Hold (Positioning): Assistance needed to correctly position infant at breast and maintain latch.  LATCH Score: 8  Before latch baby had bottle fed approximately 31mL from Dr.Brown's bottle. Blankets removed, support pillow placed and sandwich hold of the tissue by LC. Baby grasped breast, with encouragement fed at breast for 12 minutes, swallows noted; no discomfort.  Lactation Tools Discussed/Used Tools:  (spoon/curved tip syringe)  Interventions Interventions: Breast feeding basics reviewed;Assisted with latch;Support pillows;Education  Discharge Pump: Personal  Consult Status Consult Status: NICU follow-up    Danford Bad 12/23/2021, 12:51 PM

## 2021-12-23 NOTE — Progress Notes (Signed)
Postop Day  1  Subjective: no complaints, up ad lib, voiding, tolerating PO, and + flatus  Doing well, no concerns. Ambulating without difficulty, pain managed with PO meds, tolerating regular diet, and voiding without difficulty.   No fever/chills, chest pain, shortness of breath, nausea/vomiting, or leg pain. No nipple or breast pain. No headache, visual changes, or RUQ/epigastric pain.  Objective: BP (!) 146/83 (BP Location: Left Arm)   Pulse 85   Temp 99 F (37.2 C) (Oral)   Resp 20   Ht 5' (1.524 m)   Wt 70.3 kg   LMP 04/21/2021 (Exact Date)   SpO2 100%   Breastfeeding Unknown   BMI 30.27 kg/m    Physical Exam:  General: alert, cooperative, and appears stated age Breasts: soft/nontender CV: RRR Pulm: nl effort, CTABL Abdomen: soft, non-tender, active bowel sounds Uterine Fundus: firm Incision: healing well, no significant drainage, no dehiscence, no significant erythema Perineum: minimal edema, intact Lochia: appropriate DVT Evaluation: No evidence of DVT seen on physical exam. Negative Homan's sign. No cords or calf tenderness. No significant calf/ankle edema.  Recent Labs    12/23/21 0603  HGB 9.1*  HCT 26.4*  WBC 13.6*  PLT 235    Assessment/Plan: 26 y.o. G2P1103 postpartum day # 1  -Continue routine postpartum care -Lactation consult PRN for breastfeeding  -Acute blood loss anemia - hemodynamically stable and asymptomatic; start PO ferrous sulfate BID with stool softeners  -Immunization status:   needs TDap prior to discharge    Disposition: Continue inpatient postpartum care    LOS: 1 day   Sienna Stonehocker LUCY Delmar Landau, CNM 12/23/2021, 4:09 PM   ----- Chari Manning Certified Nurse Midwife [facility]

## 2021-12-24 LAB — SURGICAL PATHOLOGY

## 2021-12-24 NOTE — Progress Notes (Signed)
Post Partum Day 2 Subjective: Doing well, no complaints.  Tolerating regular diet, pain with PO meds, voiding and ambulating without difficulty. - reports babies are doing well in SCN  No CP SOB Fever,Chills, N/V or leg pain; denies nipple or breast pain, no HA change of vision, RUQ/epigastric pain  Objective: BP 109/83   Pulse 91   Temp 98.5 F (36.9 C)   Resp 20   Ht 5' (1.524 m)   Wt 70.3 kg   LMP 04/21/2021 (Exact Date)   SpO2 100%   Breastfeeding Unknown   BMI 30.27 kg/m    Physical Exam:  General: NAD Breasts: soft/nontender CV: RRR Pulm: nl effort, CTABL Abdomen: soft, NT, BS x 4 Incision: Honeycomb Dsg CDI, no erythema or drainage; OnQ site with occlusive Dsg, no erythema or drainage at site.  Lochia: small Uterine Fundus: fundus firm and 1 fb below umbilicus DVT Evaluation: no cords, ttp LEs   Recent Labs    12/23/21 0603  HGB 9.1*  HCT 26.4*  WBC 13.6*  PLT 235    Assessment/Plan: 26 y.o. G2P1103 postpartum day # 1  - Continue routine PP care - Lactation consult prn - Acute blood loss anemia - hemodynamically stable and asymptomatic; po ferrous sulfate BID with stool softeners  - Immunization status: Needs varicella prior to DC   Disposition: Does not desire Dc home today.     Randa Ngo, CNM 12/24/2021  10:26 AM

## 2021-12-25 MED ORDER — OXYCODONE HCL 5 MG PO TABS: 5.0000 mg | ORAL_TABLET | ORAL | 0 refills | Status: AC | PRN

## 2021-12-25 MED ORDER — IBUPROFEN 600 MG PO TABS: 600.0000 mg | ORAL_TABLET | Freq: Four times a day (QID) | ORAL | 1 refills | Status: AC

## 2021-12-25 MED ORDER — ACETAMINOPHEN 325 MG PO TABS: 650.0000 mg | ORAL_TABLET | Freq: Four times a day (QID) | ORAL | Status: AC | PRN

## 2021-12-25 NOTE — Progress Notes (Signed)
Reviewed AVS, discharge teaching, medications and Follow up appointment with Dr Jean Rosenthal. Patient verbalizes understanding.

## 2021-12-25 NOTE — Lactation Note (Signed)
This note was copied from a baby's chart. Lactation Consultation Note  Patient Name: Maria Gentry [id number] Date: 12/25/2021 Reason for consult: Follow-up assessment;Late-preterm 34-36.6wks Age:27 hours  Maternal Data-  see initial consult note  Has patient been taught Hand Expression?: Yes Does the patient have breastfeeding experience prior to this delivery?: Yes How long did the patient breastfeed?: Mom exclusively pumped for 10 months. !st baby with latch issues and mom with sore painful nipples.  Feeding Mother's Current Feeding Choice: Breast Milk Mom reports she has been breastfeeding babies. Per mom she is seeing improvement with breastfeeding     Lactation Tools Discussed DEBP    Interventions Interventions: Education   Discharge Discharge Education: Other (comment) (Mom will D/C home today babies in [facility]. Recommended mom take Medela parts home so when she comes to [facility]  she can pump at baby's bedside as needed. Reviewed with mom to pump(or breastfeed) 8 times/24 hours with one 4 hour stretch for sleep.) Pump: Personal (Mom reports she received her electric breastpump for home use.)  Consult Status Consult Status: PRN Date: 12/25/21 Follow-up type: In-patient  Update provided to [facility] care nurse.  Fuller Song 12/25/2021, 10:11 AM

## 2021-12-25 NOTE — Discharge Instructions (Signed)
Cesarean Delivery, Care After Refer to this sheet in the next few weeks. These instructions provide you with information on caring for yourself after your procedure. Your health care provider may also give you specific instructions. Your treatment has been planned according to current medical practices, but problems sometimes occur. Call your health care provider if you have any problems or questions after you go home. HOME CARE INSTRUCTIONS  Please leave honey comb dressing (OP Site) on for 7 days.  You may shower during this period but turn your back to the water so that the dressing does not get directly saturated by the water.   You may take the dressing off on day 7.  The easiest way to do it is in the shower.  Allow the water to run over the dressing and it usually comes off easier.   Only take over-the-counter or prescription medications as directed by your health care provider. Do not drink alcohol, especially if you are breastfeeding or taking medication to relieve pain. Do not  smoke tobacco. Continue to use good perineal care. Good perineal care includes: Wiping your perineum from front to back. Keeping your perineum clean. Check your surgical cut (incision) daily for increased redness, drainage, swelling, or separation of skin. Shower and clean your incision gently with soap and water every day, by letting warm and soapy water run over the incision, and then pat it dry. If your health care provider says it is okay, leave the incision uncovered. Use a bandage (dressing) if the incision is draining fluid or appears irritated. If the adhesive strips across the incision do not fall off within 7 days, carefully peel them off, after a shower. Hug a pillow when coughing or sneezing until your incision is healed. This helps to relieve pain. Do not use tampons, douches or have sexual intercourse, until your health care provider says it is okay. Wear a well-fitting bra that provides breast  support. Limit wearing support panties or control-top hose. Drink enough fluids to keep your urine clear or pale yellow. Eat high-fiber foods such as whole grain cereals and breads, brown rice, beans, and fresh fruits and vegetables every day. These foods may help prevent or relieve constipation. Resume activities such as climbing stairs, driving, lifting, exercising, or traveling as directed by your health care provider. Try to have someone help you with your household activities and your newborn for at least a few days after you leave the hospital. Rest as much as possible. Try to rest or take a nap when your newborn is sleeping. Increase your activities gradually. Do not lift more than 15lbs until directed by a provider. Keep all of your scheduled postpartum appointments. It is very important to keep your scheduled follow-up appointments. At these appointments, your health care provider will be checking to make sure that you are healing physically and emotionally. SEEK MEDICAL CARE IF:  You are passing large clots from your vagina. Save any clots to show your health care provider. You have a foul smelling discharge from your vagina. You have trouble urinating. You are urinating frequently. You have pain when you urinate. You have a change in your bowel movements. You have increasing redness, pain, or swelling near your incision. You have pus draining from your incision. Your incision is separating. You have painful, hard, or reddened breasts. You have a severe headache. You have blurred vision or see spots. You feel sad or depressed. You have thoughts of hurting yourself or your newborn. You have questions   about your care, the care of your newborn, or medications. You are dizzy or light-headed. You have a rash. You have pain, redness, or swelling at the site of the removed intravenous access (IV) tube. You have nausea or vomiting. You stopped breastfeeding and have not had a  menstrual period within 12 weeks of stopping. You are not breastfeeding and have not had a menstrual period within 12 weeks of delivery. You have a fever. SEEK IMMEDIATE MEDICAL CARE IF: You have persistent pain. You have chest pain. You have shortness of breath. You faint. You have leg pain. You have stomach pain. Your vaginal bleeding saturates 2 or more sanitary pads in 1 hour. MAKE SURE YOU:  Understand these instructions. Will watch your condition. Will get help right away if you are not doing well or get worse. Document Released: 03/27/2002 Document Revised: 11/19/2013 Document Reviewed: 03/01/2012 [employer] Patient Information 2015 [employer]. This information is not intended to replace advice given to you by your health care provider. Make sure you discuss any questions you have with your health care provider.  

## 2021-12-30 ENCOUNTER — Other Ambulatory Visit: Payer: Medicaid Other
